# Patient Record
Sex: Female | Born: 1965 | Race: White | Hispanic: No | Marital: Single | Smoking: Never smoker
Health system: Southern US, Community
[De-identification: ages and names within clinical notes are randomized; demographics above are authoritative.]

## PROBLEM LIST (undated history)

## (undated) DIAGNOSIS — R112 Nausea with vomiting, unspecified: Secondary | ICD-10-CM

## (undated) DIAGNOSIS — M792 Neuralgia and neuritis, unspecified: Secondary | ICD-10-CM

## (undated) DIAGNOSIS — M7742 Metatarsalgia, left foot: Secondary | ICD-10-CM

## (undated) DIAGNOSIS — R87612 Low grade squamous intraepithelial lesion on cytologic smear of cervix (LGSIL): Secondary | ICD-10-CM

## (undated) DIAGNOSIS — K589 Irritable bowel syndrome without diarrhea: Secondary | ICD-10-CM

## (undated) DIAGNOSIS — R87619 Unspecified abnormal cytological findings in specimens from cervix uteri: Secondary | ICD-10-CM

## (undated) DIAGNOSIS — Z8719 Personal history of other diseases of the digestive system: Secondary | ICD-10-CM

## (undated) DIAGNOSIS — N939 Abnormal uterine and vaginal bleeding, unspecified: Secondary | ICD-10-CM

## (undated) DIAGNOSIS — E063 Autoimmune thyroiditis: Secondary | ICD-10-CM

## (undated) DIAGNOSIS — E038 Other specified hypothyroidism: Secondary | ICD-10-CM

## (undated) DIAGNOSIS — E785 Hyperlipidemia, unspecified: Secondary | ICD-10-CM

## (undated) DIAGNOSIS — Z9889 Other specified postprocedural states: Secondary | ICD-10-CM

## (undated) DIAGNOSIS — IMO0002 Reserved for concepts with insufficient information to code with codable children: Secondary | ICD-10-CM

## (undated) HISTORY — DX: Autoimmune thyroiditis: E06.3

## (undated) HISTORY — DX: Metatarsalgia, left foot: M77.42

## (undated) HISTORY — DX: Neuralgia and neuritis, unspecified: M79.2

## (undated) HISTORY — DX: Low grade squamous intraepithelial lesion on cytologic smear of cervix (LGSIL): R87.612

## (undated) HISTORY — PX: PLACEMENT OF BREAST IMPLANTS: SHX6334

## (undated) HISTORY — DX: Other specified hypothyroidism: E03.8

## (undated) HISTORY — PX: WISDOM TOOTH EXTRACTION: SHX21

## (undated) HISTORY — DX: Hyperlipidemia, unspecified: E78.5

## (undated) HISTORY — DX: Irritable bowel syndrome, unspecified: K58.9

## (undated) HISTORY — DX: Reserved for concepts with insufficient information to code with codable children: IMO0002

## (undated) HISTORY — DX: Abnormal uterine and vaginal bleeding, unspecified: N93.9

## (undated) HISTORY — PX: ENDOMETRIAL BIOPSY: SHX622

## (undated) HISTORY — PX: TONSILLECTOMY: SUR1361

## (undated) HISTORY — DX: Unspecified abnormal cytological findings in specimens from cervix uteri: R87.619

## (undated) HISTORY — PX: AUGMENTATION MAMMAPLASTY: SUR837

---

## 2005-11-05 HISTORY — PX: OTHER SURGICAL HISTORY: SHX169

## 2006-11-05 HISTORY — PX: UPPER GI ENDOSCOPY: SHX6162

## 2010-11-05 DIAGNOSIS — R87612 Low grade squamous intraepithelial lesion on cytologic smear of cervix (LGSIL): Secondary | ICD-10-CM

## 2010-11-05 HISTORY — DX: Low grade squamous intraepithelial lesion on cytologic smear of cervix (LGSIL): R87.612

## 2011-03-16 ENCOUNTER — Encounter: Payer: Self-pay | Admitting: Family Medicine

## 2011-03-16 ENCOUNTER — Ambulatory Visit (INDEPENDENT_AMBULATORY_CARE_PROVIDER_SITE_OTHER): Payer: BC Managed Care – PPO | Admitting: Family Medicine

## 2011-03-16 DIAGNOSIS — Z13220 Encounter for screening for lipoid disorders: Secondary | ICD-10-CM

## 2011-03-16 DIAGNOSIS — R635 Abnormal weight gain: Secondary | ICD-10-CM

## 2011-03-16 DIAGNOSIS — R109 Unspecified abdominal pain: Secondary | ICD-10-CM

## 2011-03-16 DIAGNOSIS — R1084 Generalized abdominal pain: Secondary | ICD-10-CM | POA: Insufficient documentation

## 2011-03-16 LAB — POCT URINALYSIS DIPSTICK
Bilirubin, UA: NEGATIVE
Glucose, UA: NEGATIVE
Leukocytes, UA: NEGATIVE
Nitrite, UA: NEGATIVE
Protein, UA: NEGATIVE
Spec Grav, UA: 1.015
Urobilinogen, UA: 0.2
pH, UA: 6

## 2011-03-16 NOTE — Assessment & Plan Note (Signed)
Vague abd pain/ bloating with similar episode 7 yrs ago, diagnosed with hiatal hernia and IBS at the time.  Has a stressful job and is more sensitive to carbs now.  UA is normal.  Will get labs today but given her exam findings and history, I suggested she try a gluten free diet x 2 wks to see if her symptoms improved along with lots of fruits, veggies, water and increased exercise.  Had a normal EGD/ colonoscopy 7 yrs ago.

## 2011-03-16 NOTE — Patient Instructions (Signed)
Update fasting labs today. Will call you w/ results Monday.  Trial of Gluten Free Diet x 2 wks may help bloating and cramping. Try Vear Clock Colon health + a fiber supplement daily.  Use OTC Zantac as needed for heartburn.

## 2011-03-16 NOTE — Progress Notes (Signed)
  Subjective:    Patient ID: [name], female    DOB: [birth year], 45 y.o.   MRN: [id number]  HPI 45 yo WF presents for lower abdominal pain x 2 wk with cramps, burning and bloating.  Bowels are normal. Has some nausea and lack of appetite.  No vomitting, diarrhea, constipation or blood in the stool.  She has a lot of stress at work.  She had some increased urinary frequency but no dysuria.  She had a colonoscopy 7 yrs ago.  She was diagnosed with a HH in the past.  Has had some heartburn.  No hx of ulcers.   She seems more sensitive to certain foods like pasta.  She has gained about 10 lbs in the past 6 mos.    BP 117/72  Pulse 74  Temp(Src) 98.4 F (36.9 C) (Oral)  Ht 5\' 5"  (1.651 m)  Wt 128 lb (58.06 kg)  BMI 21.30 kg/m2  SpO2 99%  LMP 02/22/2011  Past Medical History  Diagnosis Date  . Hyperlipidemia     History reviewed. No pertinent past surgical history.  Family History  Problem Relation Age of Onset  . Cancer Mother   . Heart disease Father     History   Social History  . Marital Status: Single    Spouse Name: Aneta Mins    Number of Children: 1  . Years of Education: 14   Occupational History  . [employer] support     [employer]   Social History Main Topics  . Smoking status: Never Smoker   . Smokeless tobacco: Not on file  . Alcohol Use: No  . Drug Use: No  . Sexually Active: Yes    Birth Control/ Protection: Condom   Other Topics Concern  . Not on file   Social History Narrative  . No narrative on file    Allergies  Allergen Reactions  . Codeine   . Erythromycin     No current outpatient prescriptions on file.    Review of Systems  Constitutional: Positive for appetite change, fatigue and unexpected weight change. Negative for fever and chills.  Respiratory: Negative for shortness of breath.   Cardiovascular: Negative for chest pain, palpitations and leg swelling.  Gastrointestinal: Positive for abdominal pain and abdominal distention. Negative  for nausea, vomiting, diarrhea, constipation and blood in stool.  Genitourinary: Positive for frequency. Negative for urgency, hematuria and difficulty urinating.  Skin: Negative for rash.  Neurological: Negative for headaches.  Psychiatric/Behavioral: Negative for sleep disturbance and dysphoric mood. The patient is not nervous/anxious.        Objective:   Physical Exam  Constitutional: She appears well-developed and well-nourished.  HENT:  Head: Normocephalic and atraumatic.  Eyes: Conjunctivae are normal. No scleral icterus.  Neck: Neck supple. No thyromegaly present.  Cardiovascular: Normal rate, regular rhythm and normal heart sounds.   Pulmonary/Chest: Effort normal and breath sounds normal.  Abdominal: Soft. Bowel sounds are normal. She exhibits no distension and no mass. There is no tenderness. There is no guarding.  Musculoskeletal: She exhibits no edema.  Lymphadenopathy:    She has no cervical adenopathy.  Skin: Skin is warm and dry. No rash noted. No pallor.  Psychiatric: She has a normal mood and affect.          Assessment & Plan:

## 2011-03-17 ENCOUNTER — Telehealth: Payer: Self-pay | Admitting: Family Medicine

## 2011-03-17 DIAGNOSIS — R140 Abdominal distension (gaseous): Secondary | ICD-10-CM

## 2011-03-17 LAB — CBC WITH DIFFERENTIAL/PLATELET
Basophils Absolute: 0 10*3/uL (ref 0.0–0.1)
Basophils Relative: 0 % (ref 0–1)
Eosinophils Absolute: 0.1 10*3/uL (ref 0.0–0.7)
Eosinophils Relative: 1 % (ref 0–5)
HCT: 41 % (ref 36.0–46.0)
Hemoglobin: 14.1 g/dL (ref 12.0–15.0)
Lymphocytes Relative: 24 % (ref 12–46)
Lymphs Abs: 2 10*3/uL (ref 0.7–4.0)
MCH: 31.3 pg (ref 26.0–34.0)
MCHC: 34.4 g/dL (ref 30.0–36.0)
MCV: 90.9 fL (ref 78.0–100.0)
Monocytes Absolute: 0.8 10*3/uL (ref 0.1–1.0)
Monocytes Relative: 9 % (ref 3–12)
Neutro Abs: 5.5 10*3/uL (ref 1.7–7.7)
Neutrophils Relative %: 66 % (ref 43–77)
Platelets: 200 10*3/uL (ref 150–400)
RBC: 4.51 MIL/uL (ref 3.87–5.11)
RDW: 12.8 % (ref 11.5–15.5)
WBC: 8.3 10*3/uL (ref 4.0–10.5)

## 2011-03-17 LAB — COMPLETE METABOLIC PANEL WITH GFR
ALT: 10 U/L (ref 0–35)
AST: 18 U/L (ref 0–37)
Albumin: 5 g/dL (ref 3.5–5.2)
Alkaline Phosphatase: 40 U/L (ref 39–117)
BUN: 12 mg/dL (ref 6–23)
CO2: 22 mEq/L (ref 19–32)
Calcium: 9.5 mg/dL (ref 8.4–10.5)
Chloride: 101 mEq/L (ref 96–112)
Creat: 0.79 mg/dL (ref 0.40–1.20)
GFR, Est African American: >60 mL/min (ref >60)
GFR, Est Non African American: >60 mL/min (ref >60)
Glucose, Bld: 74 mg/dL (ref 70–99)
Potassium: 3.6 mEq/L (ref 3.5–5.3)
Sodium: 137 mEq/L (ref 135–145)
Total Bilirubin: 1.1 mg/dL (ref 0.3–1.2)
Total Protein: 7.4 g/dL (ref 6.0–8.3)

## 2011-03-17 LAB — TSH: TSH: 3.673 u[IU]/mL (ref 0.350–4.500)

## 2011-03-17 LAB — LIPID PANEL
Cholesterol: 173 mg/dL (ref 0–200)
HDL: 64 mg/dL (ref >39)
LDL Cholesterol: 100 mg/dL — ABNORMAL HIGH (ref 0–99)
Total CHOL/HDL Ratio: 2.7 Ratio
Triglycerides: 44 mg/dL (ref <150)
VLDL: 9 mg/dL (ref 0–40)

## 2011-03-17 LAB — LIPASE: Lipase: 29 U/L (ref 0–75)

## 2011-03-17 LAB — AMYLASE: Amylase: 37 U/L (ref 0–105)

## 2011-03-17 NOTE — Telephone Encounter (Signed)
Pls let pt know that her blood counts came back normal, fasting sugar, liver and kidney function are normal.  No sign of pancreatitis.  Thyroid looks perfect.  Cholesterol is at goal.  Repeat in 1-2 yrs.

## 2011-03-19 NOTE — Telephone Encounter (Signed)
Pt aware of the above. Pt would like to know if she was tested for ciliac disease and/or gluten intolerance bc this was the reason for her visit. Please advise.

## 2011-03-19 NOTE — Telephone Encounter (Signed)
Pt aware of the above and will return to lab

## 2011-03-19 NOTE — Telephone Encounter (Signed)
Let's see if the lab can add this on.  Order printed.  Many times the lab comes back normal but patients clinically respond to a gluten free diet, so that's why I had not checked her without a trial of gluten free diet first.

## 2011-03-21 LAB — GLIADIN ANTIBODIES, SERUM
Gliadin IgA: 11 U/mL (ref <20)
Gliadin IgG: 13.6 U/mL (ref <20)

## 2011-03-21 LAB — TISSUE TRANSGLUTAMINASE, IGA: Tissue Transglutaminase Ab, IgA: 3.7 U/mL (ref <20)

## 2011-03-21 LAB — RETICULIN ANTIBODIES, IGA W TITER: Reticulin Ab, IgA: NEGATIVE

## 2011-03-22 ENCOUNTER — Telehealth: Payer: Self-pay | Admitting: Family Medicine

## 2011-03-27 ENCOUNTER — Telehealth: Payer: Self-pay | Admitting: Family Medicine

## 2011-03-27 NOTE — Telephone Encounter (Signed)
Pls let pt know that only one of her lab tests for celiac is back so far and it is negative.  I just didn't want to keep her waiting.  The other 2 should be back this wk.

## 2011-03-27 NOTE — Telephone Encounter (Signed)
Pt notified of the current celiac negative test result.  Told other two will come back this week and she'll be notified then of those results. Jarvis Newcomer, LPN [facility]

## 2011-04-04 ENCOUNTER — Encounter (INDEPENDENT_AMBULATORY_CARE_PROVIDER_SITE_OTHER): Payer: BC Managed Care – PPO | Admitting: Obstetrics & Gynecology

## 2011-04-04 ENCOUNTER — Telehealth: Payer: Self-pay | Admitting: Family Medicine

## 2011-04-04 ENCOUNTER — Other Ambulatory Visit: Payer: Self-pay | Admitting: Obstetrics & Gynecology

## 2011-04-04 DIAGNOSIS — Z01419 Encounter for gynecological examination (general) (routine) without abnormal findings: Secondary | ICD-10-CM

## 2011-04-04 DIAGNOSIS — Z1272 Encounter for screening for malignant neoplasm of vagina: Secondary | ICD-10-CM

## 2011-04-04 DIAGNOSIS — Z113 Encounter for screening for infections with a predominantly sexual mode of transmission: Secondary | ICD-10-CM

## 2011-04-04 DIAGNOSIS — Z1239 Encounter for other screening for malignant neoplasm of breast: Secondary | ICD-10-CM

## 2011-04-05 NOTE — Assessment & Plan Note (Signed)
[name], [name]                ACCOUNT NO.:  [id number]  MEDICAL RECORD NO.:  [id number]           PATIENT TYPE:  LOCATION:  [facility] at [place]           FACILITY:  PHYSICIAN:  Elsie Lincoln, MD           DATE OF BIRTH:  DATE OF SERVICE:  04/04/2011                                 CLINIC NOTE  The patient is a 45 year old G0, P0 female who presents for annual exam. The patient has a longstanding history of abdominal pain and digestive complaints.  She has had a thorough workup including CAT scan, colonoscopy and other tests.  They never really determine anything other than irritable bowel syndrome.  She would like to try the gluten testing.  However, they have not done this yet.  I suggest that she just go gluten-free for several weeks to see she feels better in the past when she tried this, but also gave up all preservative and sugar.  She felt better within the week.  I suggest she will eliminate one item at a time from her diet.  She can better recognize what is making her feel that.  She does not have any gynecological issues.  She has regular periods, medium flow with mild pain, become regular once a month.  She is using condoms for birth control in a monogamous relationship.  OBSTETRICAL HISTORY:  She is nulliparous.  GYNECOLOGIC HISTORY:  She has never had an abnormal Pap smear.  She does not know she has ever been tested for HPV.  Her last Pap smear was over a year ago.  I do have a copy of this.  We are going to do one today. She has never been told she has had endometriosis, fibroid tumors of the uterus, ovarian cyst or sexually transmitted diseases.  MEDICAL HISTORY:  Positive for digestive issues but as described above.  SURGICAL HISTORY.: 1. Back surgery secondary to lumbar disk problems. 2. Breast implants approximately 7 years ago.  SOCIAL HISTORY:  She is employed in the area and she is very stressed at work because they are down an employee and she is  working overtime.  She does not smoke.  She drinks approximately 3 drinks of alcohol a week. She does not use drugs.  She has never been sexually or physically abused.  FAMILY HISTORY:  Positive for diabetes.  There is no history of breast, colon, endometrial or ovarian cancer.  REVIEW OF SYSTEMS:  Systemic review is positive for abdominal pain as outlined above.  There are no other problems.  MEDICATIONS:  Multivitamin daily.  ALLERGIES:  CODEINE, ERYTHROMYCIN.  PHYSICAL EXAMINATION:  VITAL SIGNS:  Pulse 64, blood pressure 103/64, weight 132, height 65 inches. GENERAL:  Well nourished, well developed, no apparent distress. HEENT:  Normocephalic, atraumatic.  Good dentition. NECK:  No masses, no thyromegaly. LUNGS:  Clear to auscultation bilaterally. HEART:  Regular rate and rhythm. BREASTS:  No masses, nontender, presence of implants.  No lymphadenopathy.  No nipple discharge. ABDOMEN:  Soft, nontender.  No organomegaly.  No hernia. PELVIC:  Genitalia, Tanner V.  No lesions on the vulva.  Vagina pink, normal rugae.  Cervix closed, nontender.  Uterus anteverted, nontender. Adnexa; no  masses, nontender, rectovaginal nodularity.  No cystocele. No rectocele.  No hemorrhoids.  EXTREMITIES:  No edema.  ASSESSMENT/PLAN:  A 45 year old female for well-woman exam. 1. Pap smear. 2. Mammogram. 3. Return to the office in a year or sooner as needed.          ______________________________ Elsie Lincoln, MD    KL/MEDQ  D:  04/04/2011  T:  04/04/2011  Job:  [id number]

## 2011-04-09 ENCOUNTER — Telehealth: Payer: Self-pay | Admitting: Family Medicine

## 2011-04-09 NOTE — Telephone Encounter (Signed)
Sherry from lab will look into.

## 2011-04-09 NOTE — Telephone Encounter (Signed)
Pls let pt know that we tracked down the rest of her labs and they were all negative for celiac dz.  Is she set up to see GI yet?

## 2011-04-09 NOTE — Telephone Encounter (Signed)
[name], pls call the lab to see what the hold up is on her remaining labs.  Thanks.

## 2011-04-09 NOTE — Telephone Encounter (Signed)
Pt aware of the above and does not wish to see GI right now.

## 2011-04-20 ENCOUNTER — Encounter: Payer: Self-pay | Admitting: Family Medicine

## 2011-04-25 NOTE — Telephone Encounter (Signed)
none

## 2011-04-30 ENCOUNTER — Telehealth: Payer: Self-pay | Admitting: Family Medicine

## 2011-04-30 NOTE — Telephone Encounter (Signed)
Pls let pt know that her blood testing for Celiac Disease came back NEGATIVE.  If she feels better on a gluten free diet, I suggest that she stay on it.  She may still have gluten sensitivity which is not picked up on testing.

## 2011-05-01 NOTE — Telephone Encounter (Signed)
Pt aware of the above  

## 2011-05-02 ENCOUNTER — Encounter (INDEPENDENT_AMBULATORY_CARE_PROVIDER_SITE_OTHER): Payer: BC Managed Care – PPO | Admitting: Obstetrics & Gynecology

## 2011-05-02 ENCOUNTER — Other Ambulatory Visit: Payer: Self-pay | Admitting: Obstetrics & Gynecology

## 2011-05-02 DIAGNOSIS — R87610 Atypical squamous cells of undetermined significance on cytologic smear of cervix (ASC-US): Secondary | ICD-10-CM

## 2011-05-15 ENCOUNTER — Ambulatory Visit: Payer: BC Managed Care – PPO

## 2011-05-17 ENCOUNTER — Encounter: Payer: Self-pay | Admitting: Family Medicine

## 2011-06-05 NOTE — Telephone Encounter (Signed)
Error

## 2011-08-03 ENCOUNTER — Ambulatory Visit (INDEPENDENT_AMBULATORY_CARE_PROVIDER_SITE_OTHER): Payer: BC Managed Care – PPO | Admitting: Family Medicine

## 2011-08-03 ENCOUNTER — Encounter: Payer: Self-pay | Admitting: Family Medicine

## 2011-08-03 VITALS — BP 108/74 | HR 74 | Wt 131.0 lb

## 2011-08-03 DIAGNOSIS — R197 Diarrhea, unspecified: Secondary | ICD-10-CM

## 2011-08-03 LAB — CBC WITH DIFFERENTIAL/PLATELET
Basophils Absolute: 0 10*3/uL (ref 0.0–0.1)
Basophils Relative: 0 % (ref 0–1)
Eosinophils Absolute: 0.1 10*3/uL (ref 0.0–0.7)
Eosinophils Relative: 2 % (ref 0–5)
HCT: 42.1 % (ref 36.0–46.0)
Hemoglobin: 14.2 g/dL (ref 12.0–15.0)
Lymphocytes Relative: 18 % (ref 12–46)
Lymphs Abs: 1.4 10*3/uL (ref 0.7–4.0)
MCH: 31.1 pg (ref 26.0–34.0)
MCHC: 33.7 g/dL (ref 30.0–36.0)
MCV: 92.3 fL (ref 78.0–100.0)
Monocytes Absolute: 0.9 10*3/uL (ref 0.1–1.0)
Monocytes Relative: 12 % (ref 3–12)
Neutro Abs: 5 10*3/uL (ref 1.7–7.7)
Neutrophils Relative %: 68 % (ref 43–77)
Platelets: 206 10*3/uL (ref 150–400)
RBC: 4.56 MIL/uL (ref 3.87–5.11)
RDW: 12.8 % (ref 11.5–15.5)
WBC: 7.3 10*3/uL (ref 4.0–10.5)

## 2011-08-03 NOTE — Progress Notes (Signed)
  Subjective:    Patient ID: [name], female    DOB: May 01, 1966, 45 y.o.   MRN: [id number]  HPI Diarrhea for about 1.5 weeks. Living off immodium right now. Stools are watery. Maybe low grade. No blood in the stool. She is getting painand cramping. Eating triggers the diarrhea. No prior hx of crohns, etc. On family hx of Gi d/o.  + nausea. No vomiting.   Stomach is constantly rumbling. Cat recently had giardia.   Review of Systems     Objective:   Physical Exam  Constitutional: She is oriented to person, place, and time. She appears well-developed and well-nourished.  HENT:  Head: Normocephalic and atraumatic.  Cardiovascular: Normal rate, regular rhythm and normal heart sounds.   Pulmonary/Chest: Effort normal and breath sounds normal.  Abdominal: Soft. Bowel sounds are normal. She exhibits no distension and no mass. There is no tenderness. There is no rebound and no guarding.  Neurological: She is alert and oriented to person, place, and time.  Skin: Skin is warm and dry.  Psychiatric: She has a normal mood and affect. Her behavior is normal.          Assessment & Plan:  Diarrhea x 2 weeks.  - will check for stool culture and for giardia. Rec stop the immodium unless absolutely has to. Stay hydrated. Will check CBC as well. I think diverticulitis is unlikely.

## 2011-08-03 NOTE — Patient Instructions (Signed)
Stay really well hydrated We will call you with the labs results.

## 2011-08-06 ENCOUNTER — Telehealth: Payer: Self-pay | Admitting: *Deleted

## 2011-08-06 NOTE — Telephone Encounter (Signed)
Message copied by Wyline Beady on Mon Aug 06, 2011  9:12 AM ------      Message from: Nani Gasser D      Created: Fri Aug 03, 2011  5:24 PM       white count is normal. No sign of systemic infection

## 2011-08-06 NOTE — Telephone Encounter (Signed)
Pt.notified

## 2011-08-07 ENCOUNTER — Telehealth: Payer: Self-pay | Admitting: *Deleted

## 2011-08-07 LAB — GIARDIA ANTIGEN: Giardia Screen (EIA): NEGATIVE

## 2011-08-07 MED ORDER — CIPROFLOXACIN HCL 500 MG PO TABS: 500.0000 mg | ORAL_TABLET | Freq: Two times a day (BID) | ORAL | Status: AC

## 2011-08-07 NOTE — Telephone Encounter (Signed)
Pt notified of results.Pt states is still having the diarrhea along with occasional flu like symptoms- joint aches, nausea but no vomiting, chills, sweats

## 2011-08-07 NOTE — Telephone Encounter (Signed)
I will send over ABX. Let me know if not better by the end of the ABX. If not better at that point will send ot GI for further eval.

## 2011-08-07 NOTE — Telephone Encounter (Signed)
Message copied by Lanae Crumbly on Tue Aug 07, 2011  3:53 PM ------      Message from: Nani Gasser D      Created: Tue Aug 07, 2011  1:00 PM       Neg for giardia. Is she still having a lot of diarrhea?

## 2011-08-08 ENCOUNTER — Telehealth: Payer: Self-pay | Admitting: *Deleted

## 2011-08-08 NOTE — Telephone Encounter (Signed)
Pt notified. KJ LPN 

## 2011-08-08 NOTE — Telephone Encounter (Signed)
Pt notified med sent and MD instructions. KJ LPN

## 2011-08-08 NOTE — Telephone Encounter (Signed)
Message copied by Lanae Crumbly on Wed Aug 08, 2011 10:14 AM ------      Message from: Nani Gasser D      Created: Tue Aug 07, 2011  5:33 PM       stool cultures were reincubated for better growth

## 2011-08-10 LAB — STOOL CULTURE

## 2011-08-28 ENCOUNTER — Ambulatory Visit: Payer: BC Managed Care – PPO

## 2012-04-08 ENCOUNTER — Encounter: Payer: Self-pay | Admitting: Obstetrics & Gynecology

## 2012-04-08 ENCOUNTER — Ambulatory Visit (INDEPENDENT_AMBULATORY_CARE_PROVIDER_SITE_OTHER): Payer: BC Managed Care – PPO | Admitting: Obstetrics & Gynecology

## 2012-04-08 VITALS — BP 103/66 | HR 64 | Temp 98.3°F | Resp 16 | Ht 65.0 in | Wt 129.0 lb

## 2012-04-08 DIAGNOSIS — Z0000 Encounter for general adult medical examination without abnormal findings: Secondary | ICD-10-CM

## 2012-04-08 DIAGNOSIS — Z1151 Encounter for screening for human papillomavirus (HPV): Secondary | ICD-10-CM

## 2012-04-08 DIAGNOSIS — Z113 Encounter for screening for infections with a predominantly sexual mode of transmission: Secondary | ICD-10-CM

## 2012-04-08 DIAGNOSIS — Z124 Encounter for screening for malignant neoplasm of cervix: Secondary | ICD-10-CM

## 2012-04-08 DIAGNOSIS — Z1231 Encounter for screening mammogram for malignant neoplasm of breast: Secondary | ICD-10-CM

## 2012-04-08 NOTE — Progress Notes (Signed)
Patient ID: [name], female   DOB: [birth year], 46 y.o.   MRN: [id number] Subjective:    [name] is a 46 y.o. female who presents for an annual exam. The patient has no complaints today. The patient is sexually active. GYN screening history: last pap: was normal. The patient wears seatbelts: yes. The patient participates in regular exercise: no. Has the patient ever been transfused or tattooed?: no. The patient reports that there is not domestic violence in her life.   Menstrual History: OB History    Grav Para Term Preterm Abortions TAB SAB Ect Mult Living                  Menarche age: 46 Patient's last menstrual period was 03/12/2012.    The following portions of the patient's history were reviewed and updated as appropriate: allergies, current medications, past family history, past medical history, past social history, past surgical history and problem list.  Review of Systems A comprehensive review of systems was negative. She is in a monogamous relationship, divorced. She moved here in 2011 from [place]. She works in Therapist, music for [employer].   Objective:    BP 103/66  Pulse 64  Temp(Src) 98.3 F (36.8 C) (Oral)  Resp 16  Ht 5\' 5"  (1.651 m)  Wt 58.514 kg (129 lb)  BMI 21.47 kg/m2  LMP 03/12/2012  General Appearance:    Alert, cooperative, no distress, appears stated age  Head:    Normocephalic, without obvious abnormality, atraumatic  Eyes:    PERRL, conjunctiva/corneas clear, EOM's intact, fundi    benign, both eyes  Ears:    Normal TM's and external ear canals, both ears  Nose:   Nares normal, septum midline, mucosa normal, no drainage    or sinus tenderness  Throat:   Lips, mucosa, and tongue normal; teeth and gums normal  Neck:   Supple, symmetrical, trachea midline, no adenopathy;    thyroid:  no enlargement/tenderness/nodules; no carotid   bruit or JVD  Back:     Symmetric, no curvature, ROM normal, no CVA tenderness  Lungs:     Clear to auscultation  bilaterally, respirations unlabored  Chest Wall:    No tenderness or deformity   Heart:    Regular rate and rhythm, S1 and S2 normal, no murmur, rub   or gallop  Breast Exam:    No tenderness, masses, or nipple abnormality  Abdomen:     Soft, non-tender, bowel sounds active all four quadrants,    no masses, no organomegaly  Genitalia:    Normal female without lesion, discharge or tenderness, NSSA, NT, no adnexal masses     Extremities:   Extremities normal, atraumatic, no cyanosis or edema  Pulses:   2+ and symmetric all extremities  Skin:   Skin color, texture, turgor normal, no rashes or lesions  Lymph nodes:   Cervical, supraclavicular, and axillary nodes normal  Neurologic:   CNII-XII intact, normal strength, sensation and reflexes    throughout  .    Assessment:    Healthy female exam.    Plan:     Mammogram. Pap smear.

## 2012-04-08 NOTE — Progress Notes (Signed)
Addended by: Granville Lewis on: 04/08/2012 04:20 PM   Modules accepted: Orders

## 2012-04-08 NOTE — Progress Notes (Signed)
Addended by: Granville Lewis on: 04/08/2012 04:26 PM   Modules accepted: Orders

## 2012-04-08 NOTE — Progress Notes (Signed)
Addended by: Allie Bossier on: 04/08/2012 04:32 PM   Modules accepted: Orders

## 2012-05-05 ENCOUNTER — Other Ambulatory Visit: Payer: Self-pay | Admitting: Obstetrics & Gynecology

## 2012-05-05 DIAGNOSIS — Z1231 Encounter for screening mammogram for malignant neoplasm of breast: Secondary | ICD-10-CM

## 2012-05-20 ENCOUNTER — Ambulatory Visit: Payer: BC Managed Care – PPO

## 2012-05-21 ENCOUNTER — Ambulatory Visit (HOSPITAL_BASED_OUTPATIENT_CLINIC_OR_DEPARTMENT_OTHER)
Admission: RE | Admit: 2012-05-21 | Discharge: 2012-05-21 | Disposition: A | Discharge status: Home / Self Care | Payer: BC Managed Care – PPO | Source: Ambulatory Visit | Attending: Obstetrics & Gynecology | Admitting: Obstetrics & Gynecology

## 2012-05-21 DIAGNOSIS — Z978 Presence of other specified devices: Secondary | ICD-10-CM | POA: Insufficient documentation

## 2012-05-21 DIAGNOSIS — Z1231 Encounter for screening mammogram for malignant neoplasm of breast: Secondary | ICD-10-CM | POA: Insufficient documentation

## 2017-05-01 ENCOUNTER — Encounter: Payer: Self-pay | Admitting: Obstetrics & Gynecology

## 2017-05-01 ENCOUNTER — Ambulatory Visit (INDEPENDENT_AMBULATORY_CARE_PROVIDER_SITE_OTHER): Payer: BLUE CROSS/BLUE SHIELD | Admitting: Obstetrics & Gynecology

## 2017-05-01 VITALS — BP 95/68 | HR 71 | Ht 65.0 in | Wt 140.0 lb

## 2017-05-01 DIAGNOSIS — Z01419 Encounter for gynecological examination (general) (routine) without abnormal findings: Secondary | ICD-10-CM

## 2017-05-01 DIAGNOSIS — Z0000 Encounter for general adult medical examination without abnormal findings: Secondary | ICD-10-CM

## 2017-05-01 NOTE — Progress Notes (Signed)
Subjective:     [name] is a 51 y.o. female here for a routine exam.  Current complaints: Last menses lasted 4 weeks.  Before this pt has monthly cycles with menses lasting 5 days.    Gynecologic History Patient's last menstrual period was 03/17/2017 (approximate). Contraception: none Last Pap: 2013. Results were: normal Last mammogram: 2013. Results were: normal  Obstetric History OB History  Gravida Para Term Preterm AB Living  0 0 0 0 0 0  SAB TAB Ectopic Multiple Live Births  0 0 0 0 0         The following portions of the patient's history were reviewed and updated as appropriate: allergies, current medications, past family history, past medical history, past social history, past surgical history and problem list.  Review of Systems Pertinent items noted in HPI and remainder of comprehensive ROS otherwise negative.    Objective:      Vitals:   05/01/17 1017  BP: 95/68  Pulse: 71  Weight: 140 lb (63.5 kg)  Height: 5\' 5"  (1.651 m)   Vitals:  WNL General appearance: alert, cooperative and no distress  HEENT: Normocephalic, without obvious abnormality, atraumatic Eyes: negative Throat: lips, mucosa, and tongue normal; teeth and gums normal  Respiratory: Clear to auscultation bilaterally  CV: Regular rate and rhythm  Breasts:  Normal appearance, no masses or tenderness, no nipple retraction or dimpling  GI: Soft, non-tender; bowel sounds normal; no masses,  no organomegaly  GU: External Genitalia:  Tanner V, no lesion Urethra:  No prolapse   Vagina: Pink, normal rugae, no blood or discharge  Cervix: No CMT, no lesion  Uterus:  Normal size and contour, non tender  Adnexa: Normal, no masses, non tender  Musculoskeletal: No edema, redness or tenderness in the calves or thighs  Skin: No lesions or rash  Lymphatic: Axillary adenopathy: none     Psychiatric: Normal mood and behavior     Assessment:    Healthy female exam.    Plan:   Establish primary  care Pap smear with code testing today Mammogram with implants today ordered CBC CMP TSH ordered Patient offered endometrial biopsy and transvaginal ultrasound for month long menstrual cycle last month. Patient also was given the option to see how next menstrual cycle was. Patient opts to wait. Patient understands if the next menstrual cycle is long or she has intermenstrual spotting, she is to come to the office in get a transvaginal ultrasound and endometrial biopsy. She understands that irregular bleeding can be a sign of endometrial cancer. Colonoscopy recommended.

## 2017-05-03 LAB — CYTOLOGY - PAP
Diagnosis: NEGATIVE
HPV: NOT DETECTED

## 2017-05-09 ENCOUNTER — Telehealth: Payer: Self-pay | Admitting: *Deleted

## 2017-05-09 NOTE — Telephone Encounter (Signed)
-----   Message from Guss Bunde, MD sent at 05/07/2017  9:03 PM EDT ----- Call patient and remind to come in for labs (fasting).

## 2017-05-09 NOTE — Telephone Encounter (Signed)
LM on voicemail reminding pt that she has not come in for her labs yet and encouraged to call for appt.

## 2017-05-10 ENCOUNTER — Telehealth: Payer: Self-pay | Admitting: General Practice

## 2017-05-10 NOTE — Telephone Encounter (Signed)
Left a detailed message on patient vm with advise as noted below. Deeya Richeson,CMA

## 2017-05-10 NOTE — Telephone Encounter (Signed)
It looks like Dr. Gala Romney already ordered her blood work so she should have her lab order from them or she can stop by their office in our [place] and pick up a copy.

## 2017-05-10 NOTE — Telephone Encounter (Signed)
[name], This patient is new and is requesting a physical. She wants to get her bloodwork done before her appt and wants to pick up lab order on Tuesday, July 10th, her appt is on July 18th.  Thank you.

## 2017-05-10 NOTE — Telephone Encounter (Signed)
Pt called.  Pt has not been seen since 2012 and she's  requesting a Physical  and wants to pick up Lab Order  On Tuesday. Her appt is on Thursday.  She's a former [facility] pt but she has seen Dr Madilyn Fireman. Thank you

## 2017-05-10 NOTE — Telephone Encounter (Signed)
Dr. Madilyn Fireman denied this request.  Pt would have to establish with a different provider that is accepting new patient. -EH/RMA

## 2017-05-14 ENCOUNTER — Other Ambulatory Visit: Payer: Self-pay | Admitting: Obstetrics & Gynecology

## 2017-05-14 LAB — CBC
HCT: 44.6 % (ref 35.0–45.0)
Hemoglobin: 14.3 g/dL (ref 11.7–15.5)
MCH: 31.3 pg (ref 27.0–33.0)
MCHC: 32.1 g/dL (ref 32.0–36.0)
MCV: 97.6 fL (ref 80.0–100.0)
MPV: 10 fL (ref 7.5–12.5)
Platelets: 216 10*3/uL (ref 140–400)
RBC: 4.57 MIL/uL (ref 3.80–5.10)
RDW: 13.1 % (ref 11.0–15.0)
WBC: 6.6 10*3/uL (ref 3.8–10.8)

## 2017-05-15 LAB — COMPREHENSIVE METABOLIC PANEL
ALT: 8 U/L (ref 6–29)
AST: 14 U/L (ref 10–35)
Albumin: 4.4 g/dL (ref 3.6–5.1)
Alkaline Phosphatase: 42 U/L (ref 33–130)
BUN: 12 mg/dL (ref 7–25)
CO2: 23 mmol/L (ref 20–31)
Calcium: 9.1 mg/dL (ref 8.6–10.4)
Chloride: 105 mmol/L (ref 98–110)
Creat: 1.04 mg/dL (ref 0.50–1.05)
Glucose, Bld: 94 mg/dL (ref 65–99)
Potassium: 4.6 mmol/L (ref 3.5–5.3)
Sodium: 139 mmol/L (ref 135–146)
Total Bilirubin: 0.8 mg/dL (ref 0.2–1.2)
Total Protein: 6.8 g/dL (ref 6.1–8.1)

## 2017-05-15 LAB — LIPID PANEL
Cholesterol: 189 mg/dL (ref <200)
HDL: 76 mg/dL (ref >50)
LDL Cholesterol: 100 mg/dL — ABNORMAL HIGH (ref <100)
Total CHOL/HDL Ratio: 2.5 Ratio (ref <5.0)
Triglycerides: 67 mg/dL (ref <150)
VLDL: 13 mg/dL (ref <30)

## 2017-05-15 LAB — TSH: TSH: 5.67 mIU/L — ABNORMAL HIGH

## 2017-05-16 ENCOUNTER — Ambulatory Visit: Payer: BLUE CROSS/BLUE SHIELD | Admitting: Family Medicine

## 2017-05-16 LAB — T4, FREE: Free T4: 1.2 ng/dL (ref 0.8–1.8)

## 2017-05-20 LAB — FREE T3 BY DIALYSIS WITH T3 TOTAL
Free T3: 194 pg/dL — ABNORMAL LOW (ref 210–440)
T3, Total: 93 ng/dL (ref 76–181)

## 2017-05-22 ENCOUNTER — Other Ambulatory Visit: Payer: Self-pay | Admitting: Physician Assistant

## 2017-05-22 ENCOUNTER — Ambulatory Visit (INDEPENDENT_AMBULATORY_CARE_PROVIDER_SITE_OTHER): Payer: BLUE CROSS/BLUE SHIELD | Admitting: Physician Assistant

## 2017-05-22 ENCOUNTER — Encounter: Payer: Self-pay | Admitting: Physician Assistant

## 2017-05-22 VITALS — BP 115/68 | HR 71 | Wt 139.0 lb

## 2017-05-22 DIAGNOSIS — E063 Autoimmune thyroiditis: Secondary | ICD-10-CM | POA: Diagnosis not present

## 2017-05-22 DIAGNOSIS — Z8379 Family history of other diseases of the digestive system: Secondary | ICD-10-CM | POA: Diagnosis not present

## 2017-05-22 DIAGNOSIS — R1084 Generalized abdominal pain: Secondary | ICD-10-CM

## 2017-05-22 DIAGNOSIS — Z7689 Persons encountering health services in other specified circumstances: Secondary | ICD-10-CM

## 2017-05-22 DIAGNOSIS — E038 Other specified hypothyroidism: Secondary | ICD-10-CM

## 2017-05-22 DIAGNOSIS — E039 Hypothyroidism, unspecified: Secondary | ICD-10-CM

## 2017-05-22 DIAGNOSIS — R768 Other specified abnormal immunological findings in serum: Secondary | ICD-10-CM | POA: Diagnosis not present

## 2017-05-22 DIAGNOSIS — Z1231 Encounter for screening mammogram for malignant neoplasm of breast: Secondary | ICD-10-CM | POA: Diagnosis not present

## 2017-05-22 HISTORY — DX: Other specified hypothyroidism: E03.8

## 2017-05-22 HISTORY — DX: Autoimmune thyroiditis: E06.3

## 2017-05-22 MED ORDER — LEVOTHYROXINE SODIUM 25 MCG PO TABS
25.0000 ug | ORAL_TABLET | Freq: Every day | ORAL | 3 refills | Status: DC
Start: 2017-05-22 — End: 2017-09-03

## 2017-05-22 MED ORDER — DICYCLOMINE HCL 10 MG PO CAPS: 10.0000 mg | ORAL_CAPSULE | Freq: Three times a day (TID) | ORAL | 1 refills | Status: DC

## 2017-05-22 NOTE — Progress Notes (Signed)
HPI:                                                                [name] is a 51 y.o. female who presents to [facility] [place] today to establish care  Current Concerns include abdominal pain  Patient reports history of IBS and hiatal hernia. States she is having recurrence of generalized abdominal pain similar to symptoms she had 10 years ago when she was diagnosed with IBS. Pain has recurred over the last 2 months. Pain is burning and intermittent. Pain is brought on after meals, and she thinks gluten may be a trigger. Endorses intermittent nausea. She has tried OTC Zantac with some relief. Denies fever, chills, unintended weight loss, change in bowel patterns, hematochezia or melena. Family history is significant for celiac sprue in her brother.  Abnormal TSH: patient was recently seen by her GYN and found to have elevated TSH and low T3 on routine labs. She does endorse fatigue and dry skin. Reports her identical twin has hypothyroidism.  Health Maintenance Health Maintenance  Topic Date Due  . Hepatitis C Screening  May 05, 1966  . HIV Screening  04/02/1979  . TETANUS/TDAP  04/02/1983  . COLONOSCOPY  04/01/2014  . MAMMOGRAM  05/21/2014  . INFLUENZA VACCINE  06/05/2017  . PAP SMEAR  05/01/2020    GYN/Sexual Health  Menstrual status: having periods  LMP: 05/08/17  Menses: regular  Last pap smear: 05/01/17, NILM, HPV neg.  History of abnormal pap smears: yes, LGSIL, CIN1 2012  Sexually active: yes  Current contraception: none   Past Medical History:  Diagnosis Date  . Abnormal pap   . Hiatal hernia   . Hyperlipidemia   . IBS (irritable bowel syndrome)   . LGSIL on Pap smear of cervix 2012   Past Surgical History:  Procedure Laterality Date  . microdiskectomy  2007   L spine, done in [place]  . PLACEMENT OF BREAST IMPLANTS    . TONSILLECTOMY    . UPPER GI ENDOSCOPY  2008  . WISDOM TOOTH EXTRACTION     Social History   Substance Use Topics  . Smoking status: Never Smoker  . Smokeless tobacco: Never Used  . Alcohol use 1.8 oz/week    3 Cans of beer per week   family history includes Celiac disease in her brother; Diabetes in her mother; Thyroid disease in her sister.  ROS: Review of Systems  Constitutional: Positive for malaise/fatigue. Negative for chills, fever and weight loss.  HENT: Negative.   Eyes: Negative.   Respiratory: Negative.   Cardiovascular: Negative.   Gastrointestinal: Positive for abdominal pain and nausea. Negative for blood in stool, constipation, diarrhea, melena and vomiting.  Genitourinary: Negative.   Musculoskeletal: Positive for joint pain (left metatarsals). Negative for falls and myalgias.  Skin: Negative.   Neurological: Negative.  Negative for weakness.  Endo/Heme/Allergies: Negative.   Psychiatric/Behavioral: The patient is nervous/anxious and has insomnia.      Medications: Current Outpatient Prescriptions  Medication Sig Dispense Refill  . dicyclomine (BENTYL) 10 MG capsule Take 1 capsule (10 mg total) by mouth 4 (four) times daily -  before meals and at bedtime. 120 capsule 1  . levothyroxine (SYNTHROID) 25 MCG tablet Take 1 tablet (25 mcg total)  by mouth daily before breakfast. 30 tablet 3   No current facility-administered medications for this visit.    Allergies  Allergen Reactions  . Codeine   . Erythromycin        Objective:  BP 115/68   Pulse 71   Wt 139 lb (63 kg)   LMP 05/08/2017   BMI 23.13 kg/m  Gen: well-groomed, cooperative, not ill-appearing, no distress HEENT: normal conjunctiva, oropharynx clear, moist mucus membranes, no thyromegaly or tenderness, neck supple, trachea midline Pulm: Normal work of breathing, normal phonation, clear to auscultation bilaterally CV: Normal rate, regular rhythm, s1 and s2 distinct, no murmurs, clicks or rubs, no carotid bruit GI: abdomen soft, nondistended, nontender, no masses, negative Murphy's  sign Neuro: alert and oriented x 3, EOM's intact,, normal tone, no tremor MSK: moving all extremities, normal gait and station, no peripheral edema Skin: warm and dry, no rashes or lesions on exposed skin Psych: normal affect, euthymic mood, normal speech and thought content   No results found for this or any previous visit (from the past 72 hour(s)). No results found.  Depression screen PHQ 2/9 05/22/2017  Decreased Interest 0  Down, Depressed, Hopeless 0  PHQ - 2 Score 0   Lab Results  Component Value Date   NA 139 05/14/2017   K 4.6 05/14/2017   CL 105 05/14/2017   CO2 23 05/14/2017   Lab Results  Component Value Date   ALT 8 05/14/2017   AST 14 05/14/2017   ALKPHOS 42 05/14/2017   BILITOT 0.8 05/14/2017    Lab Results  Component Value Date   TSH 5.67 (H) 05/14/2017   Lab Results  Component Value Date   WBC 6.6 05/14/2017   HGB 14.3 05/14/2017   HCT 44.6 05/14/2017   MCV 97.6 05/14/2017   PLT 216 05/14/2017   .  Assessment and Plan: 51 y.o. female with  1. Encounter to establish care - reviewed PMH - reviewed recent labs from 05/14/17, which were unremarkable except for thyroid dysfunction - reviewed Health Maintenance - Pap UTD - Colonoscopy due - Mammogram due - negative PHQ2/depression  screen  2. Generalized abdominal pain - no constitutional symptoms, change in bowel habits or blood in stool. Suspect multifactorial due to GERD and IBS. Will treat conservatively with scheduled Zantac and Bentyl daily - patient to try elimination diet and food diary - follow-up in 4 weeks - dicyclomine (BENTYL) 10 MG capsule; Take 1 capsule (10 mg total) by mouth 4 (four) times daily -  before meals and at bedtime.  Dispense: 120 capsule; Refill: 1  3. Encounter for screening mammogram for breast cancer - [facility] Digital Screening W/ Implants; Future  4. Hypothyroidism, unspecified type Lab Results  Component Value Date   TSH 5.67 (H) 05/14/2017  - family history of  hypothyroidism in first degree relative (identical twin sister). Patient is symptomatic. Will start low-dose Synthroid and recheck TSH in 6 weeks - levothyroxine (SYNTHROID) 25 MCG tablet; Take 1 tablet (25 mcg total) by mouth daily before breakfast.  Dispense: 30 tablet; Refill: 3 - TSH; Future - Thyroid peroxidase antibody  5. Family history of celiac sprue - Tissue Transglutaminase Abs,IgG,IgA   Patient education and anticipatory guidance given Patient agrees with treatment plan Follow-up in 4 weeks or sooner as needed  Darlyne Russian PA-C

## 2017-05-22 NOTE — Patient Instructions (Signed)
-   Start Synthroid 1 tab every morning before breakfast with water - Return to the lab in 6 weeks  - Continue Zantac 150mg  daily or twice a day - Start Bentyl as needed for abdominal pain or scheduled before meals and at bedtime - Start elimination diet and food diary. For IBS, a low FOD-MAP diet is recommended - Follow-up in 4 weeks  Diet for Irritable Bowel Syndrome When you have irritable bowel syndrome (IBS), the foods you eat and your eating habits are very important. IBS may cause various symptoms, such as abdominal pain, constipation, or diarrhea. Choosing the right foods can help ease discomfort caused by these symptoms. Work with your health care provider and dietitian to find the best eating plan to help control your symptoms. What general guidelines do I need to follow?  Keep a food diary. This will help you identify foods that cause symptoms. Write down: ? What you eat and when. ? What symptoms you have. ? When symptoms occur in relation to your meals.  Avoid foods that cause symptoms. Talk with your dietitian about other ways to get the same nutrients that are in these foods.  Eat more foods that contain fiber. Take a fiber supplement if directed by your dietitian.  Eat your meals slowly, in a relaxed setting.  Aim to eat 5-6 small meals per day. Do not skip meals.  Drink enough fluids to keep your urine clear or pale yellow.  Ask your health care provider if you should take an over-the-counter probiotic during flare-ups to help restore healthy gut bacteria.  If you have cramping or diarrhea, try making your meals low in fat and high in carbohydrates. Examples of carbohydrates are pasta, rice, whole grain breads and cereals, fruits, and vegetables.  If dairy products cause your symptoms to flare up, try eating less of them. You might be able to handle yogurt better than other dairy products because it contains bacteria that help with digestion. What foods are not  recommended? The following are some foods and drinks that may worsen your symptoms:  Fatty foods, such as [place] fries.  Milk products, such as cheese or ice cream.  Chocolate.  Alcohol.  Products with caffeine, such as coffee.  Carbonated drinks, such as soda.  The items listed above may not be a complete list of foods and beverages to avoid. Contact your dietitian for more information. What foods are good sources of fiber? Your health care provider or dietitian may recommend that you eat more foods that contain fiber. Fiber can help reduce constipation and other IBS symptoms. Add foods with fiber to your diet a little at a time so that your body can get used to them. Too much fiber at once might cause gas and swelling of your abdomen. The following are some foods that are good sources of fiber:  Apples.  Peaches.  Pears.  Berries.  Figs.  Broccoli (raw).  Cabbage.  Carrots.  Raw peas.  Kidney beans.  Lima beans.  Whole grain bread.  Whole grain cereal.  Where to find more information: [employer] for Functional Gastrointestinal Disorders: www.iffgd.[employer] of Diabetes and Digestive and Kidney Diseases: NetworkAffair.co.za.aspx This information is not intended to replace advice given to you by your health care provider. Make sure you discuss any questions you have with your health care provider. Document Released: 01/12/2004 Document Revised: 03/29/2016 Document Reviewed: 01/22/2014 Elsevier Interactive Patient Education  2018 [employer].

## 2017-05-23 LAB — THYROID PEROXIDASE ANTIBODY: Thyroperoxidase Ab SerPl-aCnc: 233 IU/mL — ABNORMAL HIGH (ref <9)

## 2017-05-23 LAB — TISSUE TRANSGLUTAMINASE ABS,IGG,IGA
Tissue Transglut Ab: 4 U/mL (ref <6)
Tissue Transglutaminase Ab, IgA: 6 U/mL — ABNORMAL HIGH (ref <4)

## 2017-05-25 LAB — TSH: TSH: 4.56 mIU/L — ABNORMAL HIGH

## 2017-05-26 NOTE — Progress Notes (Signed)
Blood work confirms that elevated TSH is due to an autoimmune disorder called Hashimoto's thyroiditis. This causes the body's immune system to produce antibodies against the thyroid. It is genetic.  Recheck TSH in 6 weeks

## 2017-05-27 DIAGNOSIS — R768 Other specified abnormal immunological findings in serum: Secondary | ICD-10-CM | POA: Insufficient documentation

## 2017-05-27 NOTE — Addendum Note (Signed)
Addended by: Nelson Chimes E on: 05/27/2017 05:18 PM   Modules accepted: Orders

## 2017-05-27 NOTE — Progress Notes (Signed)
Celiac antibody was mildly positive, increasing the likelihood of celiac disease Referring to GI for further management They may perform endoscopy and biopsy to confirm the diagnosis

## 2017-06-11 ENCOUNTER — Encounter: Payer: Self-pay | Admitting: Physician Assistant

## 2017-06-27 ENCOUNTER — Ambulatory Visit (INDEPENDENT_AMBULATORY_CARE_PROVIDER_SITE_OTHER): Payer: BLUE CROSS/BLUE SHIELD | Admitting: Physician Assistant

## 2017-06-27 ENCOUNTER — Encounter: Payer: Self-pay | Admitting: Physician Assistant

## 2017-06-27 VITALS — BP 100/68 | HR 64 | Ht 65.0 in | Wt 140.0 lb

## 2017-06-27 DIAGNOSIS — R899 Unspecified abnormal finding in specimens from other organs, systems and tissues: Secondary | ICD-10-CM

## 2017-06-27 DIAGNOSIS — R1084 Generalized abdominal pain: Secondary | ICD-10-CM

## 2017-06-27 NOTE — Patient Instructions (Signed)
Please call back and ask to speak with Amy Esterwood's nurse, Beth,  about proceeding with Endoscopy and Colonoscopy.

## 2017-06-28 NOTE — Progress Notes (Addendum)
Subjective:    Patient ID: [name], female    DOB: 1966-06-13, 51 y.o.   MRN: [id number]  HPI [name] is a pleasant 51 year old white female, new to GI today referred by Rob Bunting PA-C for evaluation of elevated TTG. Patient has history of hypothyroidism and Raynauds. She reports having undergone GI evaluation in [place] around 12 years ago. She does not have those records and does not recall who she saw. She says she underwent several studies including endoscopy and colonoscopy which she was told was negative. She was given a diagnosis of IBS. She does not think that she had celiac testing done. After that she says she drastically altered her diet and when off of preservative's processed foods and gluten and had significant improvement in her symptoms. When seen recently by her PCP she was complaining of sharp pains in her upper abdomen and abdominal burning. She says she has had this periodically over the years, worse now if she consumes Pasta etc. She says she had not been following a strict gluten-free diet for the past several months but when symptoms flared she has now been gluten-free for 3-4 weeks she not had any weight loss. Has no complaints of changes in bowel habits diarrhea melena or hematochezia. She says she's never had issues with diarrhea. Family history is negative for celiac disease positive for father with colon polyps. She had labs drawn by primary care with TTG antibody normal at 4 and TTG antibody IgA slightly elevated at 6.   Review of Systems Pertinent positive and negative review of systems were noted in the above HPI section.  All other review of systems was otherwise negative.  Outpatient Encounter Prescriptions as of 06/27/2017  Medication Sig  . levothyroxine (SYNTHROID) 25 MCG tablet Take 1 tablet (25 mcg total) by mouth daily before breakfast.  . [DISCONTINUED] dicyclomine (BENTYL) 10 MG capsule Take 1 capsule (10 mg total) by mouth 4 (four) times  daily -  before meals and at bedtime.   No facility-administered encounter medications on file as of 06/27/2017.    Allergies  Allergen Reactions  . Codeine   . Erythromycin    Patient Active Problem List   Diagnosis Date Noted  . Elevated anti-tissue transglutaminase (tTG) IgA level 05/27/2017  . Hypothyroidism due to Hashimoto's thyroiditis 05/22/2017  . Family history of celiac sprue 05/22/2017  . Generalized abdominal pain 03/16/2011   Social History   Social History  . Marital status: Single    Spouse name: Doren Custard  . Number of children: 1  . Years of education: 33   Occupational History  . [employer] support     [employer]   Social History Main Topics  . Smoking status: Never Smoker  . Smokeless tobacco: Never Used  . Alcohol use 1.8 oz/week    3 Cans of beer per week  . Drug use: No  . Sexual activity: Yes    Partners: Male    Birth control/ protection: None   Other Topics Concern  . Not on file   Social History Narrative  . No narrative on file    Ms. Mccaskill's family history includes Celiac disease in her brother; Diabetes in her mother; Thyroid disease in her sister.      Objective:    Vitals:   06/27/17 0844  BP: 100/68  Pulse: 64    Physical Exam well-developed white female in no acute distress, blood pressure 100/68 pulse 64, height 5 foot 5 weight 140 BMI 23.3. HEENT ;nontraumatic  cephalic EOMI PERRLA sclerae anicteric, Cardiovascular; regular rate and rhythm with S1-S2 no murmur or gallop, capillary clear bilaterally, Abdomen ;soft no focal tenderness no guarding or rebound no palpable mass or hepatosplenomegaly bowel sounds are present, Rectal ;exam not done, EXt; no clubbing cyanosis or edema skin warm and dry, Neuropsych ;mood and affect appropriate       Assessment & Plan:   #56 51 year old female with history of IBS and significant gluten sensitivity referred for a slightly elevated TTG antibody IgA. Rule out gluten sensitivity versus celiac  disease  #2 colon cancer surveillance-patient had 1 prior colonoscopy 12-13 years ago in [place] by patient report negative #3 hypothyroidism #4 Raynauds  Plan; options were discussed with the patient. We discussed pursuing expanded celiac panel, and/or EGD with small bowel biopsy. She was advised that she should continue to consume gluten during testing . She does not want to pursue endoscopy at this time, and doesn't want to pursue further labs. She says she feels better off gluten and does not want to restart for fear of exacerbating her symptoms. We also discussed colonoscopy for surveillance which she should have at age 37. She does not wish to pursue colonoscopy at this time. She would like to think about their options and call back if she decides to proceed. Patient will be established with Dr. Carlean Purl Greater than 50% of the visit spent in education, and counseling  Petrice Beedy Genia Harold PA-C 06/28/2017   Cc: Ottis Stain*   Agree with Ms. Genia Harold assessment and plan. Gatha Mayer, MD, [facility]

## 2017-07-12 ENCOUNTER — Encounter: Payer: Self-pay | Admitting: Obstetrics & Gynecology

## 2017-07-22 ENCOUNTER — Other Ambulatory Visit: Payer: Self-pay | Admitting: *Deleted

## 2017-07-22 ENCOUNTER — Encounter: Payer: Self-pay | Admitting: *Deleted

## 2017-07-22 DIAGNOSIS — N939 Abnormal uterine and vaginal bleeding, unspecified: Secondary | ICD-10-CM

## 2017-07-23 ENCOUNTER — Telehealth: Payer: Self-pay | Admitting: Physician Assistant

## 2017-07-23 DIAGNOSIS — E038 Other specified hypothyroidism: Principal | ICD-10-CM

## 2017-07-23 DIAGNOSIS — E065 Other chronic thyroiditis: Secondary | ICD-10-CM

## 2017-07-23 DIAGNOSIS — Z1159 Encounter for screening for other viral diseases: Secondary | ICD-10-CM

## 2017-07-23 NOTE — Telephone Encounter (Signed)
See note below.  Orders are pending. Please see if they are correct. -EH/RMA

## 2017-07-23 NOTE — Telephone Encounter (Signed)
Orders signed Please clarify that she wanted the Hepatitis C screening and not a Hepatitis B titer (which would be to show immunity/previous vaccination) Also remind patient she is due for her mammogram and she just needs to schedule it [phone]

## 2017-07-23 NOTE — Telephone Encounter (Signed)
Patient called left vm requesting lab order in order to get thyroid med and on my chart she got a notification that she should get a Hep B and wanted to include that in blood work. Would like to go Oct 1st please call to adv lab order is sent. Thanks

## 2017-07-24 NOTE — Telephone Encounter (Signed)
Recommendations left on vm -EH/RMA  

## 2017-07-25 ENCOUNTER — Other Ambulatory Visit: Payer: Self-pay | Admitting: *Deleted

## 2017-07-25 DIAGNOSIS — E065 Other chronic thyroiditis: Secondary | ICD-10-CM

## 2017-07-25 DIAGNOSIS — Z1159 Encounter for screening for other viral diseases: Secondary | ICD-10-CM

## 2017-07-25 DIAGNOSIS — E038 Other specified hypothyroidism: Principal | ICD-10-CM

## 2017-07-27 LAB — TSH: TSH: 2.72 mIU/L

## 2017-07-27 LAB — HEPATITIS C ANTIBODY
Hepatitis C Ab: NONREACTIVE
SIGNAL TO CUT-OFF: 0.03 (ref <1.00)

## 2017-07-29 NOTE — Progress Notes (Signed)
Good morning [name],  Your thyroid function is normal.  Your hepatitis c screening test was negative.  Best, Evlyn Clines

## 2017-08-01 ENCOUNTER — Telehealth: Payer: Self-pay | Admitting: *Deleted

## 2017-08-01 NOTE — Telephone Encounter (Signed)
Email sent via Epic letting Nelson Chimes, [place] aware of labs that Dr Gala Romney had srawn on a mutual pt in July of 2018

## 2017-08-01 NOTE — Telephone Encounter (Signed)
-----   Message from Guss Bunde, MD sent at 07/28/2017 12:47 PM EDT ----- Can you make sure her PCP saw these labs.  Make note in Epic.

## 2017-08-12 ENCOUNTER — Other Ambulatory Visit: Payer: BLUE CROSS/BLUE SHIELD

## 2017-08-12 ENCOUNTER — Other Ambulatory Visit: Payer: BLUE CROSS/BLUE SHIELD | Admitting: Obstetrics & Gynecology

## 2017-08-29 ENCOUNTER — Ambulatory Visit (INDEPENDENT_AMBULATORY_CARE_PROVIDER_SITE_OTHER): Payer: BLUE CROSS/BLUE SHIELD

## 2017-08-29 ENCOUNTER — Ambulatory Visit (INDEPENDENT_AMBULATORY_CARE_PROVIDER_SITE_OTHER): Payer: BLUE CROSS/BLUE SHIELD | Admitting: Obstetrics & Gynecology

## 2017-08-29 ENCOUNTER — Encounter: Payer: Self-pay | Admitting: Obstetrics & Gynecology

## 2017-08-29 VITALS — BP 112/66 | HR 63 | Resp 16 | Ht 65.0 in | Wt 133.0 lb

## 2017-08-29 DIAGNOSIS — R9389 Abnormal findings on diagnostic imaging of other specified body structures: Secondary | ICD-10-CM

## 2017-08-29 DIAGNOSIS — N926 Irregular menstruation, unspecified: Secondary | ICD-10-CM

## 2017-08-29 DIAGNOSIS — Z3202 Encounter for pregnancy test, result negative: Secondary | ICD-10-CM

## 2017-08-29 DIAGNOSIS — N939 Abnormal uterine and vaginal bleeding, unspecified: Secondary | ICD-10-CM

## 2017-08-29 LAB — POCT URINE PREGNANCY: Preg Test, Ur: NEGATIVE

## 2017-08-29 NOTE — Progress Notes (Signed)
   Subjective:    Patient ID: [name], female    DOB: [birth year], 51 y.o.   MRN: [id number]  HPI 51 yo P0 here for a EMBX. She has a h/o irregular periods, may bleed for a week or even 2 weeks per month. She had a gyn u/s done today and the results are not available. She wants to proceed with the biopsy prior to getting the u/s results.   Review of Systems     Objective:   Physical Exam Well nourished, well hydrated white female, no apparent distress Breathing, conversing, and ambulating normally  UPT negative, consent signed, time out done Cervix prepped with betadine and grasped with a single tooth tenaculum Uterus sounded to 9 cm Pipelle used for 1 pass with a moderate amount of tissue obtained. She tolerated the procedure well.      Assessment & Plan:  DUB- await pathology and u/s results RTC 1 week

## 2017-09-02 ENCOUNTER — Telehealth: Payer: Self-pay

## 2017-09-02 NOTE — Telephone Encounter (Signed)
Called pt's phone to let her know about endometrial biopsy results and there was no answer. I left a message on the voicemail letting the pt know that I have results for her and provided her with the office's number to call us back

## 2017-09-03 ENCOUNTER — Telehealth: Payer: Self-pay

## 2017-09-03 ENCOUNTER — Encounter: Payer: Self-pay | Admitting: Physician Assistant

## 2017-09-03 DIAGNOSIS — E039 Hypothyroidism, unspecified: Secondary | ICD-10-CM

## 2017-09-03 MED ORDER — LEVOTHYROXINE SODIUM 25 MCG PO TABS: 25.0000 ug | ORAL_TABLET | Freq: Every day | ORAL | 0 refills | Status: DC

## 2017-09-03 NOTE — Telephone Encounter (Signed)
Spoke with pt and she is aware that the results from her endometrial biopsy came back as negative and that she needs a follow up appt with Hulan Fray to discuss plan (per Dr.Leggett). Appt was made.

## 2017-09-09 ENCOUNTER — Ambulatory Visit (INDEPENDENT_AMBULATORY_CARE_PROVIDER_SITE_OTHER): Payer: BLUE CROSS/BLUE SHIELD | Admitting: Physician Assistant

## 2017-09-09 ENCOUNTER — Encounter: Payer: Self-pay | Admitting: Physician Assistant

## 2017-09-09 VITALS — BP 119/78 | HR 76 | Wt 136.6 lb

## 2017-09-09 DIAGNOSIS — Z79899 Other long term (current) drug therapy: Secondary | ICD-10-CM

## 2017-09-09 DIAGNOSIS — Z9289 Personal history of other medical treatment: Secondary | ICD-10-CM

## 2017-09-09 DIAGNOSIS — N939 Abnormal uterine and vaginal bleeding, unspecified: Secondary | ICD-10-CM | POA: Insufficient documentation

## 2017-09-09 DIAGNOSIS — Z9889 Other specified postprocedural states: Secondary | ICD-10-CM | POA: Insufficient documentation

## 2017-09-09 DIAGNOSIS — E039 Hypothyroidism, unspecified: Secondary | ICD-10-CM

## 2017-09-09 HISTORY — DX: Abnormal uterine and vaginal bleeding, unspecified: N93.9

## 2017-09-09 NOTE — Patient Instructions (Addendum)
Please call [phone] to schedule your mammogram   Goiter A goiter is an enlarged thyroid gland. The thyroid gland is located in the lower front of the neck. The gland produces hormones that regulate mood, body temperature, pulse rate, and digestion. Most goiters are painless and are not a cause for serious concern. Goiters and conditions that cause goiters can be treated, if necessary. What are the causes? Causes of this condition include:  Diseases that attack healthy cells in your body (autoimmune diseases) and affect your thyroid function, such as: ? Graves disease. This causes too much thyroid hormone to be produced and it makes your thyroid overly active (hyperthyroidism). ? Hashimoto disease. This type of inflammation of the thyroid (thyroiditis) causes too little thyroid hormone to be produced and it makes your thyroid not active enough (hypothyroidism).  Other conditions that cause thyroiditis.  Nodular goiter. This means that there are one or more small growths on your thyroid. These can create too much thyroid hormone.  Pregnancy.  Thyroid cancer. This is rare.  Certain medicines.  Radiation exposure.  Iodine deficiency.  In some cases, the cause may not be known (idiopathic). What increases the risk? This condition is more likely to develop in:  People who have a family history of goiter.  Women.  People who do not get enough iodine in their diet.  People who are older than 51.  People who smoke tobacco.  What are the signs or symptoms? Common symptoms of this condition include:  Swelling in the lower part of the neck. This swelling can range from a very small bump to a large lump.  A tight feeling in the throat.  A hoarse voice.  Other symptoms include:  Coughing.  Wheezing.  Difficulty swallowing.  Difficulty breathing.  Bulging neck veins.  Dizziness.  In some cases, there are no symptoms and thyroid hormone levels may be normal. When  a goiter is the result of hyperthyroidism, symptoms may also include:  Nervousness or restlessness.  Inability to tolerate heat.  Unexplained weight loss.  Diarrhea.  Change in the texture of hair or skin.  Changes in heart beat, such as skipped beats, extra beats, or a rapid heart rate.  Loss of menstruation.  Shaky hands.  Increased appetite.  Sleep problems.  When a goiter is the result of hypothyroidism, symptoms may also include:  Feeling like you have no energy (lethargy).  Inability to tolerate cold.  Weight gain that is not explained by a change in diet or exercise habits.  Dry skin.  Coarse hair.  Menstrual irregularity.  Constipation.  Sadness or depression.  How is this diagnosed? This condition may be diagnosed with a medical history and physical exam. You may also have other tests, including:  Blood tests to check thyroid function.  Imaging tests, such as: ? Ultrasonography. ? CT scan. ? MRI. ? Thyroid scan. You will be given a safe radioactive injection, then images will be taken of your thyroid.  Tissue sample (biopsy) of the goiter or any nodules. This checks to see if the goiter or nodules are cancerous.  How is this treated? Treatment for this condition depends on the cause. Treatment may include:  Medicines to control your thyroid.  Anti-inflammatory or steroid medicines, if inflammation is the cause.  Iodine supplements or changes in diet, if the goiter is caused by iodine deficiency.  Radiation therapy.  Surgery to remove your thyroid.  In some cases, no treatment is necessary, and your health care provider will monitor  your condition at regular checkups. Follow these instructions at home:  Follow recommendations from your health care provider for any changes to your diet.  Take over-the-counter and prescription medicines only as told by your health care provider.  Do not use any tobacco products, including cigarettes,  chewing tobacco, or e-cigarettes. If you need help quitting, ask your health care provider.  Keep all follow-up appointments as told by your health care provider. This is important. Contact a health care provider if:  Your symptoms do not get better with treatment. Get help right away if:  You develop sudden, unexplained confusion or other mental changes.  You have nausea, vomiting, or diarrhea.  You develop a fever.  Your skin or the whites of your eyes appear yellow (jaundice).  You develop chest pain.  You have trouble breathing or swallowing.  You suddenly become very weak.  You experience extreme restlessness. This information is not intended to replace advice given to you by your health care provider. Make sure you discuss any questions you have with your health care provider. Document Released: 04/11/2010 Document Revised: 05/11/2016 Document Reviewed: 10/18/2014 Elsevier Interactive Patient Education  [employer].

## 2017-09-09 NOTE — Progress Notes (Signed)
HPI:                                                                [name] is a 51 y.o. female who presents to [place]: [place] today for medication management  Hypothyroidism: taking Synthroid daily. Compliant with medications. No adverse effects. Denies symptoms of hypo/hyperthyroidism. Fatigue has improved.  She recently had an endometrial biopsy for AUB with Dr. Hulan Fray showing polypoid secretory endometrium; no hyperplasia, atypia or malignancy.  Past Medical History:  Diagnosis Date  . Abnormal pap   . Hiatal hernia   . Hyperlipidemia   . Hypothyroidism due to Hashimoto's thyroiditis 05/22/2017  . IBS (irritable bowel syndrome)   . LGSIL on Pap smear of cervix 2012  . Metatarsalgia of left foot   . Neuritis    Past Surgical History:  Procedure Laterality Date  . ENDOMETRIAL BIOPSY    . microdiskectomy  2007   L spine, done in [place]  . PLACEMENT OF BREAST IMPLANTS    . TONSILLECTOMY    . UPPER GI ENDOSCOPY  2008  . WISDOM TOOTH EXTRACTION     Social History   Tobacco Use  . Smoking status: Never Smoker  . Smokeless tobacco: Never Used  Substance Use Topics  . Alcohol use: Yes    Alcohol/week: 1.8 oz    Types: 3 Cans of beer per week   family history includes Celiac disease in her brother; Diabetes in her mother; Thyroid disease in her sister.  ROS: negative except as noted in the HPI  Medications: Current Outpatient Medications  Medication Sig Dispense Refill  . dicyclomine (BENTYL) 10 MG capsule   1  . levothyroxine (SYNTHROID) 25 MCG tablet Take 1 tablet (25 mcg total) by mouth daily before breakfast. 90 tablet 0   No current facility-administered medications for this visit.    Allergies  Allergen Reactions  . Codeine   . Erythromycin        Objective:  BP 119/78   Pulse 76   Wt 136 lb 9.6 oz (62 kg)   LMP 08/15/2017   SpO2 98%   BMI 22.73 kg/m  Gen:  alert, not ill-appearing, no distress,  appropriate for age 51: head normocephalic without obvious abnormality, conjunctiva and cornea clear, trachea midline Pulm: Normal work of breathing, normal phonation Neuro: alert and oriented x 3, no tremor MSK: extremities atraumatic, normal gait and station Skin: intact, no rashes on exposed skin, no jaundice, no cyanosis Psych: well-groomed, cooperative, good eye contact, euthymic mood, affect mood-congruent, speech is articulate, and thought processes clear and goal-directed     No results found for this or any previous visit (from the past 72 hour(s)). No results found.    Assessment and Plan: 51 y.o. female with   1. Encounter for long-term current use of medication - TSH; Future  2. Hypothyroidism, unspecified type Lab Results  Component Value Date   TSH 2.72 07/26/2017  - continue Levothyroxine 25 mcg. Recheck in 6 months - TSH; Future   Patient education and anticipatory guidance given Patient agrees with treatment plan Follow-up in 6 months or sooner as needed if symptoms worsen or fail to improve  Darlyne Russian PA-C

## 2017-09-19 ENCOUNTER — Ambulatory Visit (INDEPENDENT_AMBULATORY_CARE_PROVIDER_SITE_OTHER): Payer: BLUE CROSS/BLUE SHIELD | Admitting: Obstetrics & Gynecology

## 2017-09-19 ENCOUNTER — Encounter: Payer: Self-pay | Admitting: Obstetrics & Gynecology

## 2017-09-19 VITALS — BP 112/67 | HR 84 | Resp 16 | Ht 65.0 in | Wt 133.0 lb

## 2017-09-19 DIAGNOSIS — N926 Irregular menstruation, unspecified: Secondary | ICD-10-CM

## 2017-09-19 MED ORDER — MISOPROSTOL 200 MCG PO TABS: ORAL_TABLET | ORAL | 0 refills | Status: DC

## 2017-09-19 NOTE — Progress Notes (Signed)
   Subjective:    Patient ID: [name], female    DOB: [birth year], 51 y.o.   MRN: [id number]  HPI 51 yo DW P0 here to discuss her EMBX and u/s results. She has irregular periods, may bleed for a week or even 2 weeks. This has been going on for about 6 months. Pain is not a big issue for her.   Review of Systems She uses condoms.    Objective:   Physical Exam Breathing, conversing, and ambulating normally Well nourished, well hydrated White female, no apparent distress  IMPRESSION: Mildly inhomogeneous and slightly thickened anterior myometrium of the uterus with questionable mass effect upon the endometrial complex and adjacent poorly defined basal layer of the endometrial complex, cannot exclude adenomyosis.  Small amount of nonspecific free pelvic fluid.  Remainder of exam unremarkable.     Assessment & Plan:  Plan for HTA with d&c pretreat with cytotec I sent an email to [facility] to schedule this.

## 2017-09-20 ENCOUNTER — Encounter (HOSPITAL_COMMUNITY): Payer: Self-pay

## 2017-11-07 ENCOUNTER — Encounter (HOSPITAL_BASED_OUTPATIENT_CLINIC_OR_DEPARTMENT_OTHER): Payer: Self-pay | Admitting: *Deleted

## 2017-11-11 ENCOUNTER — Encounter (HOSPITAL_BASED_OUTPATIENT_CLINIC_OR_DEPARTMENT_OTHER)
Admission: RE | Admit: 2017-11-11 | Discharge: 2017-11-11 | Disposition: A | Discharge status: Home / Self Care | Payer: Managed Care, Other (non HMO) | Source: Ambulatory Visit | Attending: Obstetrics & Gynecology | Admitting: Obstetrics & Gynecology

## 2017-11-11 DIAGNOSIS — N938 Other specified abnormal uterine and vaginal bleeding: Secondary | ICD-10-CM | POA: Diagnosis present

## 2017-11-11 DIAGNOSIS — Z9889 Other specified postprocedural states: Secondary | ICD-10-CM | POA: Diagnosis not present

## 2017-11-11 DIAGNOSIS — Z79890 Hormone replacement therapy (postmenopausal): Secondary | ICD-10-CM | POA: Diagnosis not present

## 2017-11-11 DIAGNOSIS — N840 Polyp of corpus uteri: Secondary | ICD-10-CM | POA: Diagnosis not present

## 2017-11-11 DIAGNOSIS — E063 Autoimmune thyroiditis: Secondary | ICD-10-CM | POA: Diagnosis not present

## 2017-11-11 DIAGNOSIS — K449 Diaphragmatic hernia without obstruction or gangrene: Secondary | ICD-10-CM | POA: Diagnosis not present

## 2017-11-11 DIAGNOSIS — Z8349 Family history of other endocrine, nutritional and metabolic diseases: Secondary | ICD-10-CM | POA: Diagnosis not present

## 2017-11-11 DIAGNOSIS — Z833 Family history of diabetes mellitus: Secondary | ICD-10-CM | POA: Diagnosis not present

## 2017-11-11 DIAGNOSIS — Z8379 Family history of other diseases of the digestive system: Secondary | ICD-10-CM | POA: Diagnosis not present

## 2017-11-11 LAB — CBC
HCT: 42.4 % (ref 36.0–46.0)
Hemoglobin: 13.8 g/dL (ref 12.0–15.0)
MCH: 30.7 pg (ref 26.0–34.0)
MCHC: 32.5 g/dL (ref 30.0–36.0)
MCV: 94.2 fL (ref 78.0–100.0)
Platelets: 203 10*3/uL (ref 150–400)
RBC: 4.5 MIL/uL (ref 3.87–5.11)
RDW: 13 % (ref 11.5–15.5)
WBC: 6.1 10*3/uL (ref 4.0–10.5)

## 2017-11-11 LAB — POCT PREGNANCY, URINE: Preg Test, Ur: NEGATIVE

## 2017-11-13 ENCOUNTER — Encounter (HOSPITAL_BASED_OUTPATIENT_CLINIC_OR_DEPARTMENT_OTHER)
Admission: RE | Disposition: A | Discharge status: Home / Self Care | Payer: Self-pay | Source: Ambulatory Visit | Attending: Obstetrics & Gynecology

## 2017-11-13 ENCOUNTER — Ambulatory Visit (HOSPITAL_BASED_OUTPATIENT_CLINIC_OR_DEPARTMENT_OTHER): Payer: Managed Care, Other (non HMO) | Admitting: Anesthesiology

## 2017-11-13 ENCOUNTER — Other Ambulatory Visit: Payer: Self-pay

## 2017-11-13 ENCOUNTER — Encounter (HOSPITAL_BASED_OUTPATIENT_CLINIC_OR_DEPARTMENT_OTHER): Payer: Self-pay | Admitting: *Deleted

## 2017-11-13 ENCOUNTER — Ambulatory Visit (HOSPITAL_BASED_OUTPATIENT_CLINIC_OR_DEPARTMENT_OTHER)
Admission: RE | Admit: 2017-11-13 | Discharge: 2017-11-13 | Disposition: A | Discharge status: Home / Self Care | Payer: Managed Care, Other (non HMO) | Source: Ambulatory Visit | Attending: Obstetrics & Gynecology | Admitting: Obstetrics & Gynecology

## 2017-11-13 DIAGNOSIS — K449 Diaphragmatic hernia without obstruction or gangrene: Secondary | ICD-10-CM | POA: Insufficient documentation

## 2017-11-13 DIAGNOSIS — Z8379 Family history of other diseases of the digestive system: Secondary | ICD-10-CM | POA: Insufficient documentation

## 2017-11-13 DIAGNOSIS — N840 Polyp of corpus uteri: Secondary | ICD-10-CM | POA: Diagnosis not present

## 2017-11-13 DIAGNOSIS — N938 Other specified abnormal uterine and vaginal bleeding: Secondary | ICD-10-CM | POA: Diagnosis not present

## 2017-11-13 DIAGNOSIS — Z9889 Other specified postprocedural states: Secondary | ICD-10-CM | POA: Insufficient documentation

## 2017-11-13 DIAGNOSIS — Z8349 Family history of other endocrine, nutritional and metabolic diseases: Secondary | ICD-10-CM | POA: Insufficient documentation

## 2017-11-13 DIAGNOSIS — E063 Autoimmune thyroiditis: Secondary | ICD-10-CM | POA: Insufficient documentation

## 2017-11-13 DIAGNOSIS — Z79890 Hormone replacement therapy (postmenopausal): Secondary | ICD-10-CM | POA: Insufficient documentation

## 2017-11-13 DIAGNOSIS — Z833 Family history of diabetes mellitus: Secondary | ICD-10-CM | POA: Insufficient documentation

## 2017-11-13 HISTORY — DX: Nausea with vomiting, unspecified: R11.2

## 2017-11-13 HISTORY — DX: Personal history of other diseases of the digestive system: Z87.19

## 2017-11-13 HISTORY — PX: DILITATION & CURRETTAGE/HYSTROSCOPY WITH HYDROTHERMAL ABLATION: SHX5570

## 2017-11-13 HISTORY — DX: Other specified postprocedural states: Z98.890

## 2017-11-13 SURGERY — DILATATION & CURETTAGE/HYSTEROSCOPY WITH HYDROTHERMAL ABLATION
Anesthesia: General | Site: Uterus

## 2017-11-13 MED ORDER — MIDAZOLAM HCL 2 MG/2ML IJ SOLN
1.0000 mg | INTRAMUSCULAR | Status: DC | PRN
  Administered 2017-11-13 (×2): 2 mg via INTRAVENOUS

## 2017-11-13 MED ORDER — LIDOCAINE 2% (20 MG/ML) 5 ML SYRINGE
INTRAMUSCULAR | Status: AC
  Filled 2017-11-13: qty 5

## 2017-11-13 MED ORDER — IBUPROFEN 600 MG PO TABS: 600.0000 mg | ORAL_TABLET | Freq: Four times a day (QID) | ORAL | 1 refills | Status: DC | PRN

## 2017-11-13 MED ORDER — ONDANSETRON HCL 4 MG/2ML IJ SOLN
INTRAMUSCULAR | Status: DC | PRN
  Administered 2017-11-13: 4 mg via INTRAVENOUS

## 2017-11-13 MED ORDER — SCOPOLAMINE 1 MG/3DAYS TD PT72: 1.0000 | MEDICATED_PATCH | Freq: Once | TRANSDERMAL | Status: DC | PRN

## 2017-11-13 MED ORDER — PROPOFOL 10 MG/ML IV BOLUS
INTRAVENOUS | Status: DC | PRN
  Administered 2017-11-13: 100 mg via INTRAVENOUS

## 2017-11-13 MED ORDER — PROMETHAZINE HCL 25 MG/ML IJ SOLN: 6.2500 mg | INTRAMUSCULAR | Status: DC | PRN

## 2017-11-13 MED ORDER — DEXAMETHASONE SODIUM PHOSPHATE 4 MG/ML IJ SOLN
INTRAMUSCULAR | Status: DC | PRN
  Administered 2017-11-13: 10 mg via INTRAVENOUS

## 2017-11-13 MED ORDER — ROCURONIUM BROMIDE 10 MG/ML (PF) SYRINGE
PREFILLED_SYRINGE | INTRAVENOUS | Status: AC
  Filled 2017-11-13: qty 5

## 2017-11-13 MED ORDER — FENTANYL CITRATE (PF) 100 MCG/2ML IJ SOLN
INTRAMUSCULAR | Status: AC
  Filled 2017-11-13: qty 2

## 2017-11-13 MED ORDER — LACTATED RINGERS IV SOLN
INTRAVENOUS | Status: DC
  Administered 2017-11-13: 09:00:00 via INTRAVENOUS

## 2017-11-13 MED ORDER — ONDANSETRON HCL 4 MG/2ML IJ SOLN
INTRAMUSCULAR | Status: AC
  Filled 2017-11-13: qty 2

## 2017-11-13 MED ORDER — PROPOFOL 500 MG/50ML IV EMUL
INTRAVENOUS | Status: DC | PRN
  Administered 2017-11-13: 150 ug/kg/min via INTRAVENOUS
  Administered 2017-11-13: 10:00:00 via INTRAVENOUS

## 2017-11-13 MED ORDER — PHENYLEPHRINE HCL 10 MG/ML IJ SOLN
INTRAMUSCULAR | Status: DC | PRN
  Administered 2017-11-13: 120 ug via INTRAVENOUS

## 2017-11-13 MED ORDER — OXYCODONE-ACETAMINOPHEN 5-325 MG PO TABS: 1.0000 | ORAL_TABLET | Freq: Four times a day (QID) | ORAL | 0 refills | Status: DC | PRN

## 2017-11-13 MED ORDER — BUPIVACAINE HCL (PF) 0.5 % IJ SOLN
INTRAMUSCULAR | Status: DC | PRN
  Administered 2017-11-13: 10 mL

## 2017-11-13 MED ORDER — LIDOCAINE HCL (CARDIAC) 20 MG/ML IV SOLN
INTRAVENOUS | Status: DC | PRN
  Administered 2017-11-13: 50 mg via INTRAVENOUS

## 2017-11-13 MED ORDER — SCOPOLAMINE 1 MG/3DAYS TD PT72
1.0000 | MEDICATED_PATCH | Freq: Once | TRANSDERMAL | Status: DC
  Administered 2017-11-13: 1.5 mg via TRANSDERMAL

## 2017-11-13 MED ORDER — KETOROLAC TROMETHAMINE 30 MG/ML IJ SOLN
INTRAMUSCULAR | Status: DC | PRN
  Administered 2017-11-13: 30 mg via INTRAVENOUS

## 2017-11-13 MED ORDER — DEXAMETHASONE SODIUM PHOSPHATE 10 MG/ML IJ SOLN
INTRAMUSCULAR | Status: AC
  Filled 2017-11-13: qty 1

## 2017-11-13 MED ORDER — SCOPOLAMINE 1 MG/3DAYS TD PT72
MEDICATED_PATCH | TRANSDERMAL | Status: AC
  Filled 2017-11-13: qty 1

## 2017-11-13 MED ORDER — FENTANYL CITRATE (PF) 100 MCG/2ML IJ SOLN: 25.0000 ug | INTRAMUSCULAR | Status: DC | PRN

## 2017-11-13 MED ORDER — FENTANYL CITRATE (PF) 100 MCG/2ML IJ SOLN
50.0000 ug | INTRAMUSCULAR | Status: DC | PRN
  Administered 2017-11-13: 50 ug via INTRAVENOUS

## 2017-11-13 MED ORDER — MIDAZOLAM HCL 2 MG/2ML IJ SOLN
INTRAMUSCULAR | Status: AC
  Filled 2017-11-13: qty 2

## 2017-11-13 MED ORDER — SODIUM CHLORIDE 0.9 % IR SOLN
Status: DC | PRN
Start: 2017-11-13 — End: 2017-11-13
  Administered 2017-11-13: 1000 mL

## 2017-11-13 MED ORDER — PROPOFOL 10 MG/ML IV BOLUS
INTRAVENOUS | Status: AC
  Filled 2017-11-13: qty 20

## 2017-11-13 SURGICAL SUPPLY — 23 items
BRIEF STRETCH FOR OB PAD XXL (UNDERPADS AND DIAPERS) ×3 IMPLANT
CANISTER SUCT 3000ML PPV (MISCELLANEOUS) ×3 IMPLANT
CLOTH BEACON ORANGE TIMEOUT ST (SAFETY) IMPLANT
CONTAINER PREFILL 10% NBF 60ML (FORM) ×3 IMPLANT
DILATOR CANAL MILEX (MISCELLANEOUS) IMPLANT
ELECT REM PT RETURN 9FT ADLT (ELECTROSURGICAL)
ELECTRODE REM PT RTRN 9FT ADLT (ELECTROSURGICAL) IMPLANT
GLOVE BIO SURGEON STRL SZ 6.5 (GLOVE) ×2 IMPLANT
GLOVE BIO SURGEONS STRL SZ 6.5 (GLOVE) ×1
GLOVE BIOGEL PI IND STRL 7.0 (GLOVE) ×1 IMPLANT
GLOVE BIOGEL PI INDICATOR 7.0 (GLOVE) ×2
GLOVE ECLIPSE 6.5 STRL STRAW (GLOVE) ×3 IMPLANT
GOWN STRL REUS W/ TWL XL LVL3 (GOWN DISPOSABLE) ×1 IMPLANT
GOWN STRL REUS W/TWL LRG LVL3 (GOWN DISPOSABLE) ×3 IMPLANT
GOWN STRL REUS W/TWL XL LVL3 (GOWN DISPOSABLE) ×2
NEEDLE SPNL 18GX3.5 QUINCKE PK (NEEDLE) ×3 IMPLANT
PACK VAGINAL MINOR WOMEN LF (CUSTOM PROCEDURE TRAY) ×3 IMPLANT
PAD OB MATERNITY 4.3X12.25 (PERSONAL CARE ITEMS) ×3 IMPLANT
PAD PREP 24X48 CUFFED NSTRL (MISCELLANEOUS) ×3 IMPLANT
SET GENESYS HTA PROCERVA (MISCELLANEOUS) IMPLANT
SLEEVE SCD COMPRESS KNEE MED (MISCELLANEOUS) ×3 IMPLANT
SYR 30ML LL (SYRINGE) ×3 IMPLANT
TOWEL OR 17X24 6PK STRL BLUE (TOWEL DISPOSABLE) ×6 IMPLANT

## 2017-11-13 NOTE — Anesthesia Procedure Notes (Signed)
Procedure Name: LMA Insertion Performed by: Verita Lamb, CRNA Pre-anesthesia Checklist: Patient identified, Suction available, Emergency Drugs available, Patient being monitored and Timeout performed Preoxygenation: Pre-oxygenation with 100% oxygen Induction Type: IV induction Ventilation: Mask ventilation without difficulty LMA: LMA inserted LMA Size: 3.0 Placement Confirmation: CO2 detector,  positive ETCO2 and breath sounds checked- equal and bilateral Tube secured with: Tape Dental Injury: Teeth and Oropharynx as per pre-operative assessment

## 2017-11-13 NOTE — H&P (Signed)
[name] is an 52 yo DW P0 here to have a HTA and d&c. She has irregular periods, may bleed for a week or even 2 weeks. This has been going on for about 6 months. Pain is not a big issue for her.   Review of Systems She uses condoms.    Objective:   Physical Exam Breathing, conversing, and ambulating normally Well nourished, well hydrated White female, no apparent distress  IMPRESSION: Mildly inhomogeneous and slightly thickened anterior myometrium of the uterus with questionable mass effect upon the endometrial complex and adjacent poorly defined basal layer of the endometrial complex, cannot exclude adenomyosis.  Small amount of nonspecific free pelvic fluid.  Remainder of exam unremarkable.      Patient's last menstrual period was 11/01/2017 (exact date).    Past Medical History:  Diagnosis Date  . Abnormal pap   . Abnormal uterine bleeding (AUB) 09/09/2017  . History of hiatal hernia   . Hyperlipidemia   . Hypothyroidism due to Hashimoto's thyroiditis 05/22/2017  . IBS (irritable bowel syndrome)   . LGSIL on Pap smear of cervix 2012  . Metatarsalgia of left foot   . Neuritis   . PONV (postoperative nausea and vomiting)     Past Surgical History:  Procedure Laterality Date  . ENDOMETRIAL BIOPSY    . microdiskectomy  2007   L spine, done in [place]  . PLACEMENT OF BREAST IMPLANTS    . TONSILLECTOMY    . UPPER GI ENDOSCOPY  2008  . WISDOM TOOTH EXTRACTION      Family History  Problem Relation Age of Onset  . Diabetes Mother   . Thyroid disease Sister   . Celiac disease Brother     Social History:  reports that  has never smoked. she has never used smokeless tobacco. She reports that she drinks about 1.8 oz of alcohol per week. She reports that she does not use drugs.  Allergies:  Allergies  Allergen Reactions  . Codeine   . Erythromycin     Medications Prior to Admission  Medication Sig Dispense Refill Last Dose  . levothyroxine (SYNTHROID) 25  MCG tablet Take 1 tablet (25 mcg total) by mouth daily before breakfast. 90 tablet 0 11/07/2017 at Unknown time  . misoprostol (CYTOTEC) 200 MCG tablet Take 3 pills by mouth the night before procedure. 3 tablet 0     ROS  She does not want kids, uses condoms faithfully. Works for [employer] 5\' 5"  (1.651 m), weight 56.7 kg (125 lb), last menstrual period 11/01/2017. Physical Exam  No results found for this or any previous visit (from the past 24 hour(s)).  No results found.  Assessment/Plan: DUB- plan for HTA, d&c.  She understands the risks of surgery, including, but not to infection, bleeding, DVTs, damage to bowel, bladder, ureters. She wishes to proceed.     Seena Ritacco C Lilybelle Mayeda 11/13/2017, 8:16 AM

## 2017-11-13 NOTE — Discharge Instructions (Signed)
Dilation and Curettage or Vacuum Curettage, Care After °This sheet gives you information about how to care for yourself after your procedure. Your health care provider may also give you more specific instructions. If you have problems or questions, contact your health care provider. °What can I expect after the procedure? °After your procedure, it is common to have: °· Mild pain or cramping. °· Some vaginal bleeding or spotting. ° °These may last for up to 2 weeks after your procedure. °Follow these instructions at home: °Activity ° °· Do not drive or use heavy machinery while taking prescription pain medicine. °· Avoid driving for the first 24 hours after your procedure. °· Take frequent, short walks, followed by rest periods, throughout the day. Ask your health care provider what activities are safe for you. After 1-2 days, you may be able to return to your normal activities. °· Do not lift anything heavier than 10 lb (4.5 kg) until your health care provider approves. °· For at least 2 weeks, or as long as told by your health care provider, do not: °? Douche. °? Use tampons. °? Have sexual intercourse. °General instructions ° °· Take over-the-counter and prescription medicines only as told by your health care provider. This is especially important if you take blood thinning medicine. °· Do not take baths, swim, or use a hot tub until your health care provider approves. Take showers instead of baths. °· Wear compression stockings as told by your health care provider. These stockings help to prevent blood clots and reduce swelling in your legs. °· It is your responsibility to get the results of your procedure. Ask your health care provider, or the department performing the procedure, when your results will be ready. °· Keep all follow-up visits as told by your health care provider. This is important. °Contact a health care provider if: °· You have severe cramps that get worse or that do not get better with  medicine. °· You have severe abdominal pain. °· You cannot drink fluids without vomiting. °· You develop pain in a different area of your pelvis. °· You have bad-smelling vaginal discharge. °· You have a rash. °Get help right away if: °· You have vaginal bleeding that soaks more than one sanitary pad in 1 hour, for 2 hours in a row. °· You pass large blood clots from your vagina. °· You have a fever that is above 100.4°F (38.0°C). °· Your abdomen feels very tender or hard. °· You have chest pain. °· You have shortness of breath. °· You cough up blood. °· You feel dizzy or light-headed. °· You faint. °· You have pain in your neck or shoulder area. °This information is not intended to replace advice given to you by your health care provider. Make sure you discuss any questions you have with your health care provider. °Document Released: 10/19/2000 Document Revised: 06/20/2016 Document Reviewed: 05/24/2016 °Elsevier Interactive Patient Education © 2018 [employer] ° ° °Post Anesthesia Home Care Instructions ° °Activity: °Get plenty of rest for the remainder of the day. A responsible individual must stay with you for 24 hours following the procedure.  °For the next 24 hours, DO NOT: °-Drive a car °-Operate machinery °-Drink alcoholic beverages °-Take any medication unless instructed by your physician °-Make any legal decisions or sign important papers. ° °Meals: °Start with liquid foods such as gelatin or soup. Progress to regular foods as tolerated. Avoid greasy, spicy, heavy foods. If nausea and/or vomiting occur, drink only clear liquids until the nausea   and/or vomiting subsides. Call your physician if vomiting continues. ° °Special Instructions/Symptoms: °Your throat may feel dry or sore from the anesthesia or the breathing tube placed in your throat during surgery. If this causes discomfort, gargle with warm salt water. The discomfort should disappear within 24 hours. ° °If you had a scopolamine patch placed  behind your ear for the management of post- operative nausea and/or vomiting: ° °1. The medication in the patch is effective for 72 hours, after which it should be removed.  Wrap patch in a tissue and discard in the trash. Wash hands thoroughly with soap and water. °2. You may remove the patch earlier than 72 hours if you experience unpleasant side effects which may include dry mouth, dizziness or visual disturbances. °3. Avoid touching the patch. Wash your hands with soap and water after contact with the patch. °  ° ° °

## 2017-11-13 NOTE — Transfer of Care (Signed)
Immediate Anesthesia Transfer of Care Note  Patient: [name]  Procedure(s) Performed: DILATATION & CURETTAGE/HYSTEROSCOPY WITH HYDROTHERMAL ABLATION (N/A Uterus)  Patient Location: PACU  Anesthesia Type:General  Level of Consciousness: awake, alert  and oriented  Airway & Oxygen Therapy: Patient Spontanous Breathing and Patient connected to face mask oxygen  Post-op Assessment: Report given to RN and Post -op Vital signs reviewed and stable  Post vital signs: Reviewed and stable  Last Vitals:  Vitals:   11/13/17 0828  BP: 107/67  Pulse: 61  Resp: 16  Temp: 36.8 C  SpO2: 100%    Last Pain:  Vitals:   11/13/17 0828  TempSrc: Oral  PainSc: 2       Patients Stated Pain Goal: 2 (63/84/53 [id number])  Complications: No apparent anesthesia complications

## 2017-11-13 NOTE — Op Note (Signed)
11/13/2017  10:19 AM  PATIENT:  [name]  52 y.o. female  PRE-OPERATIVE DIAGNOSIS:  Dysfunctional uterine bleeding  POST-OPERATIVE DIAGNOSIS:  Dysfunctional uterine bleeding  PROCEDURE:  Procedure(s): DILATATION & CURETTAGE/HYSTEROSCOPY WITH HYDROTHERMAL ABLATION (N/A)  SURGEON:  Surgeon(s) and Role:    * Nedda Gains, Wilhemina Cash, MD - Primary  ANESTHESIA:   local and general  EBL:  minimal   BLOOD ADMINISTERED:none  DRAINS: none   LOCAL MEDICATIONS USED:  MARCAINE     SPECIMEN:  Source of Specimen:  uterine curettings  DISPOSITION OF SPECIMEN:  PATHOLOGY  COUNTS:  YES  TOURNIQUET:  * No tourniquets in log *  DICTATION: .Dragon Dictation  PLAN OF CARE: Discharge to home after PACU  PATIENT DISPOSITION:  PACU - hemodynamically stable.   Delay start of Pharmacological VTE agent (>24hrs) due to surgical blood loss or risk of bleeding: not applicable  The risks, benefits, and alternatives of surgery were explained, understood, and accepted. All questions were answered. Consents were signed. In the operating room general anesthesia was applied without complication, and she was placed in the dorsal lithotomy position. Her vagina was prepped and draped in the usual sterile fashion.  A bimanual exam revealed a normal size and shape, anteverted mobile uterus. Her adnexa were nonenlarged. Her pubic arch is fair if she needs a vaginal hysterectomy in the future. She has a moderate amount of uterine prolapse. A speculum was placed and a single-tooth tenaculum was used to grasp the anterior lip of her cervix. A total of 20 mL of 0.5% Marcaine was used to perform a paracervical block. Her uterus sounded to 8 cm. Her cervix was carefully and slowly dilated to accommodate a small curette. A curettage was done in all quadrants and the fundus of the uterus. A moderate amount of  tissue was obtained. A gritty sensation was appreciated throughout. I then proceeded with the HTA procedure. Hysteroscopy  revealed a shaggy endometrium.   The ostia were both visualized. The procedure proceeded as expected. The HTA rep was present for the procedure. There was no bleeding noted at the end of the case. She was taken to the recovery room after being extubated. She tolerated the procedure well.

## 2017-11-13 NOTE — Anesthesia Postprocedure Evaluation (Signed)
Anesthesia Post Note  Patient: [name]  Procedure(s) Performed: DILATATION & CURETTAGE/HYSTEROSCOPY WITH HYDROTHERMAL ABLATION (N/A Uterus)     Patient location during evaluation: PACU Anesthesia Type: General Level of consciousness: awake and alert Pain management: pain level controlled Vital Signs Assessment: post-procedure vital signs reviewed and stable Respiratory status: spontaneous breathing, nonlabored ventilation and respiratory function stable Cardiovascular status: blood pressure returned to baseline and stable Postop Assessment: no apparent nausea or vomiting Anesthetic complications: no    Last Vitals:  Vitals:   11/13/17 1100 11/13/17 1115  BP: 108/86 109/67  Pulse: (!) 55 (!) 56  Resp: 18 16  Temp:  (!) 36.4 C  SpO2: 100% 99%    Last Pain:  Vitals:   11/13/17 1115  TempSrc:   PainSc: 2                  Catalina Gravel

## 2017-11-13 NOTE — Anesthesia Preprocedure Evaluation (Addendum)
Anesthesia Evaluation  Patient identified by MRN, date of birth, ID band Patient awake    Reviewed: Allergy & Precautions, NPO status , Patient's Chart, lab work & pertinent test results  History of Anesthesia Complications (+) PONV and history of anesthetic complications  Airway Mallampati: II  TM Distance: >3 FB Neck ROM: Full    Dental  (+) Teeth Intact, Dental Advisory Given   Pulmonary neg pulmonary ROS,    Pulmonary exam normal breath sounds clear to auscultation       Cardiovascular Exercise Tolerance: Good negative cardio ROS Normal cardiovascular exam Rhythm:Regular Rate:Normal     Neuro/Psych negative neurological ROS  negative psych ROS   GI/Hepatic negative GI ROS, Neg liver ROS,   Endo/Other  Hypothyroidism   Renal/GU negative Renal ROS     Musculoskeletal negative musculoskeletal ROS (+)   Abdominal   Peds  Hematology negative hematology ROS (+)   Anesthesia Other Findings Day of surgery medications reviewed with the patient.  Reproductive/Obstetrics AUB                            Anesthesia Physical Anesthesia Plan  ASA: II  Anesthesia Plan: General   Post-op Pain Management:    Induction: Intravenous  PONV Risk Score and Plan: 4 or greater and Dexamethasone, Ondansetron, Scopolamine patch - Pre-op, TIVA and Midazolam  Airway Management Planned: LMA  Additional Equipment:   Intra-op Plan:   Post-operative Plan: Extubation in OR  Informed Consent: I have reviewed the patients History and Physical, chart, labs and discussed the procedure including the risks, benefits and alternatives for the proposed anesthesia with the patient or authorized representative who has indicated his/her understanding and acceptance.   Dental advisory given  Plan Discussed with: CRNA  Anesthesia Plan Comments: (Risks/benefits of general anesthesia discussed with patient  including risk of damage to teeth, lips, gum, and tongue, nausea/vomiting, allergic reactions to medications, and the possibility of heart attack, stroke and death.  All patient questions answered.  Patient wishes to proceed.)       Anesthesia Quick Evaluation

## 2017-11-14 ENCOUNTER — Encounter (HOSPITAL_BASED_OUTPATIENT_CLINIC_OR_DEPARTMENT_OTHER): Payer: Self-pay | Admitting: Obstetrics & Gynecology

## 2017-12-04 ENCOUNTER — Other Ambulatory Visit: Payer: Self-pay | Admitting: Physician Assistant

## 2017-12-04 DIAGNOSIS — E039 Hypothyroidism, unspecified: Secondary | ICD-10-CM

## 2018-03-07 ENCOUNTER — Other Ambulatory Visit: Payer: Self-pay | Admitting: Physician Assistant

## 2018-03-07 DIAGNOSIS — E039 Hypothyroidism, unspecified: Secondary | ICD-10-CM

## 2018-06-05 ENCOUNTER — Other Ambulatory Visit: Payer: Self-pay | Admitting: Physician Assistant

## 2018-06-05 DIAGNOSIS — E039 Hypothyroidism, unspecified: Secondary | ICD-10-CM

## 2018-06-30 ENCOUNTER — Encounter: Payer: Self-pay | Admitting: Physician Assistant

## 2018-06-30 ENCOUNTER — Ambulatory Visit (INDEPENDENT_AMBULATORY_CARE_PROVIDER_SITE_OTHER): Payer: Managed Care, Other (non HMO) | Admitting: Physician Assistant

## 2018-06-30 VITALS — BP 111/71 | HR 62 | Wt 135.0 lb

## 2018-06-30 DIAGNOSIS — N912 Amenorrhea, unspecified: Secondary | ICD-10-CM

## 2018-06-30 DIAGNOSIS — E038 Other specified hypothyroidism: Secondary | ICD-10-CM | POA: Diagnosis not present

## 2018-06-30 DIAGNOSIS — N951 Menopausal and female climacteric states: Secondary | ICD-10-CM

## 2018-06-30 DIAGNOSIS — R61 Generalized hyperhidrosis: Secondary | ICD-10-CM

## 2018-06-30 DIAGNOSIS — Z1231 Encounter for screening mammogram for malignant neoplasm of breast: Secondary | ICD-10-CM

## 2018-06-30 DIAGNOSIS — Z5181 Encounter for therapeutic drug level monitoring: Secondary | ICD-10-CM

## 2018-06-30 DIAGNOSIS — Z131 Encounter for screening for diabetes mellitus: Secondary | ICD-10-CM

## 2018-06-30 DIAGNOSIS — Z9889 Other specified postprocedural states: Secondary | ICD-10-CM

## 2018-06-30 DIAGNOSIS — Z130 Encounter for screening for diseases of the blood and blood-forming organs and certain disorders involving the immune mechanism: Secondary | ICD-10-CM

## 2018-06-30 DIAGNOSIS — E063 Autoimmune thyroiditis: Secondary | ICD-10-CM

## 2018-06-30 NOTE — Progress Notes (Signed)
HPI:                                                                [name] is a 52 y.o. female who presents to [place]: [place] today for nightsweats  For the last 3 weeks patient has felt fatigued, increased anxiety, experiencing night sweats, and multiple nighttime awakenings. Reports she has never slept soundly through the night due to back pain and being a "poor sleeper." The sweating has exacerbated this. Denies drenching nightsweats, cough, dyspnea, fevers, chills, weight loss. Has also had some daytime flushing lasting several minutes 2-3 times per day. LMP was end of 2018, she had a D&C in January. Recently started taking Black Cohosh. Takes low-dose Synthroid 25 mcg for Hashimoto's disease and reports she is compliant with this Drinks 2 beers per day and has done so for years.  Depression screen [facility] 2/9 05/22/2017  Decreased Interest 0  Down, Depressed, Hopeless 0  PHQ - 2 Score 0    No flowsheet data found.    Past Medical History:  Diagnosis Date  . Abnormal pap   . Abnormal uterine bleeding (AUB) 09/09/2017  . History of hiatal hernia   . Hyperlipidemia   . Hypothyroidism due to Hashimoto's thyroiditis 05/22/2017  . IBS (irritable bowel syndrome)   . LGSIL on Pap smear of cervix 2012  . Metatarsalgia of left foot   . Neuritis   . PONV (postoperative nausea and vomiting)    Past Surgical History:  Procedure Laterality Date  . DILITATION & CURRETTAGE/HYSTROSCOPY WITH HYDROTHERMAL ABLATION N/A 11/13/2017   Procedure: DILATATION & CURETTAGE/HYSTEROSCOPY WITH HYDROTHERMAL ABLATION;  Surgeon: Yvonne Filbert, MD;  Location: [facility];  Service: Gynecology;  Laterality: N/A;  . ENDOMETRIAL BIOPSY    . microdiskectomy  2007   L spine, done in [place]  . PLACEMENT OF BREAST IMPLANTS    . TONSILLECTOMY    . UPPER GI ENDOSCOPY  2008  . WISDOM TOOTH EXTRACTION     Social History   Tobacco Use  . Smoking status:  Never Smoker  . Smokeless tobacco: Never Used  Substance Use Topics  . Alcohol use: Yes    Alcohol/week: 3.0 standard drinks    Types: 3 Cans of beer per week   family history includes Celiac disease in her brother; Diabetes in her mother; Thyroid disease in her sister.    ROS: negative except as noted in the HPI  Medications: Current Outpatient Medications  Medication Sig Dispense Refill  . levothyroxine (SYNTHROID, LEVOTHROID) 25 MCG tablet TAKE 1 TABLET(25 MCG) BY MOUTH DAILY BEFORE BREAKFAST 30 tablet 0  . ibuprofen (ADVIL,MOTRIN) 600 MG tablet Take 1 tablet (600 mg total) by mouth every 6 (six) hours as needed. 30 tablet 1  . misoprostol (CYTOTEC) 200 MCG tablet Take 3 pills by mouth the night before procedure. 3 tablet 0   No current facility-administered medications for this visit.    Allergies  Allergen Reactions  . Codeine   . Erythromycin        Objective:  BP 111/71   Pulse 62   Wt 135 lb (61.2 kg)   BMI 22.47 kg/m  Gen:  alert, not ill-appearing, no distress, appropriate for age 68: head normocephalic without  obvious abnormality, conjunctiva and cornea clear, trachea midline Pulm: Normal work of breathing, normal phonation Neuro: alert and oriented x 3, no tremor MSK: extremities atraumatic, normal gait and station Skin: intact, no rashes on exposed skin, no jaundice, no cyanosis Psych: well-groomed, cooperative, good eye contact, euthymic mood, affect mood-congruent, speech is articulate, and thought processes clear and goal-directed    No results found for this or any previous visit (from the past 72 hour(s)). No results found.    Assessment and Plan: 52 y.o. female with   .[name] was seen today for night sweats.  Diagnoses and all orders for this visit:  Menopausal vasomotor syndrome  Night sweats -     TSH -     FSH/LH -     Comprehensive metabolic panel -     Hemoglobin A1c -     CBC  Amenorrhea -     FSH/LH  Hypothyroidism  due to Hashimoto's thyroiditis -     TSH  Breast cancer screening by mammogram -     MM 3D SCREEN BREAST BILATERAL; Future  Screening for diabetes mellitus -     Hemoglobin A1c  Screening for blood disease -     Comprehensive metabolic panel -     CBC   Night sweats - given age and associated daytime flushing, fatigue and anxiety, suspect this is due to vasomotor instability from peri-menopause. Low suspicion for infectious etiology. We will check FSH/LH since we are unable to determine LMP. Will also check TSH given presence of Hashimotos. - counseled on non-hormonal treatments/general measures for managing symptoms of menopause - brief discussion on risks and benefits of HRT. She is not likely a candidate for this yet since LMP was <1 year ago. She understands she will need a baseline screening mammogram followed by yearly mammogram  Patient education and anticipatory guidance given Patient agrees with treatment plan Follow-up in 8 weeks or sooner as needed if symptoms worsen or fail to improve  Yvonne Russian PA-C

## 2018-07-01 ENCOUNTER — Encounter: Payer: Self-pay | Admitting: Physician Assistant

## 2018-07-01 DIAGNOSIS — N951 Menopausal and female climacteric states: Secondary | ICD-10-CM | POA: Insufficient documentation

## 2018-07-01 LAB — COMPREHENSIVE METABOLIC PANEL
AG Ratio: 1.8 (calc) (ref 1.0–2.5)
ALT: 13 U/L (ref 6–29)
AST: 15 U/L (ref 10–35)
Albumin: 4.7 g/dL (ref 3.6–5.1)
Alkaline phosphatase (APISO): 49 U/L (ref 33–130)
BUN: 15 mg/dL (ref 7–25)
CO2: 30 mmol/L (ref 20–32)
Calcium: 9.9 mg/dL (ref 8.6–10.4)
Chloride: 106 mmol/L (ref 98–110)
Creat: 0.98 mg/dL (ref 0.50–1.05)
Globulin: 2.6 g/dL (calc) (ref 1.9–3.7)
Glucose, Bld: 86 mg/dL (ref 65–99)
Potassium: 4 mmol/L (ref 3.5–5.3)
Sodium: 142 mmol/L (ref 135–146)
Total Bilirubin: 0.6 mg/dL (ref 0.2–1.2)
Total Protein: 7.3 g/dL (ref 6.1–8.1)

## 2018-07-01 LAB — CBC
HCT: 40.8 % (ref 35.0–45.0)
Hemoglobin: 14 g/dL (ref 11.7–15.5)
MCH: 31.1 pg (ref 27.0–33.0)
MCHC: 34.3 g/dL (ref 32.0–36.0)
MCV: 90.7 fL (ref 80.0–100.0)
MPV: 10.8 fL (ref 7.5–12.5)
Platelets: 196 10*3/uL (ref 140–400)
RBC: 4.5 10*6/uL (ref 3.80–5.10)
RDW: 12.1 % (ref 11.0–15.0)
WBC: 6.1 10*3/uL (ref 3.8–10.8)

## 2018-07-01 LAB — FSH/LH
FSH: 71.5 m[IU]/mL
LH: 46.1 m[IU]/mL

## 2018-07-01 LAB — HEMOGLOBIN A1C
Hgb A1c MFr Bld: 5.3 % of total Hgb (ref <5.7)
Mean Plasma Glucose: 105 (calc)
eAG (mmol/L): 5.8 (calc)

## 2018-07-01 LAB — TSH: TSH: 5.93 mIU/L — ABNORMAL HIGH

## 2018-07-01 MED ORDER — LEVOTHYROXINE SODIUM 50 MCG PO TABS: 50.0000 ug | ORAL_TABLET | Freq: Every day | ORAL | 3 refills | Status: DC

## 2018-07-01 NOTE — Progress Notes (Signed)
Hi [name],  Your labs confirm that you are in peri-menopause. Your TSH is also increased, which means you need additional thyroid support. I am increasing your Synthroid to 50 mcg. Be sure you are taking this every morning on an empty stomach by itself. Let's recheck your level in 3 months.

## 2018-07-01 NOTE — Addendum Note (Signed)
Addended by: Nelson Chimes E on: 07/01/2018 11:45 AM   Modules accepted: Orders

## 2019-04-22 ENCOUNTER — Other Ambulatory Visit: Payer: Self-pay | Admitting: Physician Assistant

## 2019-04-22 DIAGNOSIS — E038 Other specified hypothyroidism: Secondary | ICD-10-CM

## 2019-05-12 ENCOUNTER — Encounter: Payer: Self-pay | Admitting: Physician Assistant

## 2019-05-12 ENCOUNTER — Ambulatory Visit (INDEPENDENT_AMBULATORY_CARE_PROVIDER_SITE_OTHER): Payer: Managed Care, Other (non HMO) | Admitting: Physician Assistant

## 2019-05-12 ENCOUNTER — Other Ambulatory Visit: Payer: Self-pay

## 2019-05-12 VITALS — BP 113/75 | HR 61 | Temp 98.1°F | Wt 146.0 lb

## 2019-05-12 DIAGNOSIS — R5382 Chronic fatigue, unspecified: Secondary | ICD-10-CM | POA: Diagnosis not present

## 2019-05-12 DIAGNOSIS — E063 Autoimmune thyroiditis: Secondary | ICD-10-CM

## 2019-05-12 DIAGNOSIS — G8929 Other chronic pain: Secondary | ICD-10-CM

## 2019-05-12 DIAGNOSIS — F418 Other specified anxiety disorders: Secondary | ICD-10-CM

## 2019-05-12 DIAGNOSIS — E038 Other specified hypothyroidism: Secondary | ICD-10-CM

## 2019-05-12 DIAGNOSIS — G4701 Insomnia due to medical condition: Secondary | ICD-10-CM

## 2019-05-12 DIAGNOSIS — Z5181 Encounter for therapeutic drug level monitoring: Secondary | ICD-10-CM

## 2019-05-12 DIAGNOSIS — M545 Low back pain, unspecified: Secondary | ICD-10-CM

## 2019-05-12 MED ORDER — PAROXETINE HCL 10 MG PO TABS: 10.0000 mg | ORAL_TABLET | Freq: Every day | ORAL | 1 refills | Status: DC

## 2019-05-12 NOTE — Patient Instructions (Signed)
Stress Stress is a normal reaction to life events. Stress is what you feel when life demands more than you are used to, or more than you think you can handle. Some stress can be useful, such as studying for a test or meeting a deadline at work. Stress that occurs too often or for too long can cause problems. It can affect your emotional health and interfere with relationships and normal daily activities. Too much stress can weaken your body's defense system (immune system) and increase your risk for physical illness. If you already have a medical problem, stress can make it worse. What are the causes? All sorts of life events can cause stress. An event that causes stress for one person may not be stressful for another person. Major life events, whether positive or negative, commonly cause stress. Examples include:  Losing a job or starting a new job.  Losing a loved one.  Moving to a new town or home.  Getting married or divorced.  Having a baby.  Injury or illness. Less obvious life events can also cause stress, especially if they occur day after day or in combination with each other. Examples include:  Working long hours.  Driving in traffic.  Caring for children.  Being in debt.  Being in a difficult relationship. What are the signs or symptoms? Stress can cause emotional symptoms, including:  Anxiety. This is feeling worried, afraid, on edge, overwhelmed, or out of control.  Anger, including irritation or impatience.  Depression. This is feeling sad, down, helpless, or guilty.  Trouble focusing, remembering, or making decisions. Stress can cause physical symptoms, including:  Aches and pains. These may affect your head, neck, back, stomach, or other areas of your body.  Tight muscles or a clenched jaw.  Low energy.  Trouble sleeping. Stress can cause unhealthy behaviors, including:  Eating to feel better (overeating) or skipping meals.  Working too much or  putting off tasks.  Smoking, drinking alcohol, or using drugs to feel better. How is this diagnosed? Stress is diagnosed through an assessment by your health care provider. He or she may diagnose this condition based on:  Your symptoms and any stressful life events.  Your medical history.  Tests to rule out other causes of your symptoms. Depending on your condition, your health care provider may refer you to a specialist for further evaluation. How is this treated?  Stress management techniques are the recommended treatment for stress. Medicine is not typically recommended for the treatment of stress. Techniques to reduce your reaction to stressful life events include:  Stress identification. Monitor yourself for symptoms of stress and identify what causes stress for you. These skills may help you to avoid or prepare for stressful events.  Time management. Set your priorities, keep a calendar of events, and learn to say "no." Taking these actions can help you avoid making too many commitments. Techniques for coping with stress include:  Rethinking the problem. Try to think realistically about stressful events rather than ignoring them or overreacting. Try to find the positives in a stressful situation rather than focusing on the negatives.  Exercise. Physical exercise can release both physical and emotional tension. The key is to find a form of exercise that you enjoy and do it regularly.  Relaxation techniques. These relax the body and mind. The key is to find one or more that you enjoy and use the technique(s) regularly. Examples include: ? Meditation, deep breathing, or progressive relaxation techniques. ? Yoga or tai chi. ?  Biofeedback, mindfulness techniques, or journaling. ? Listening to music, being out in nature, or participating in other hobbies.  Practicing a healthy lifestyle. Eat a balanced diet, drink plenty of water, limit or avoid caffeine, and get plenty of sleep.   Having a strong support network. Spend time with family, friends, or other people you enjoy being around. Express your feelings and talk things over with someone you trust. Counseling or talk therapy with a mental health professional may be helpful if you are having trouble managing stress on your own. Follow these instructions at home: Lifestyle   Avoid drugs.  Do not use any products that contain nicotine or tobacco, such as cigarettes and e-cigarettes. If you need help quitting, ask your health care provider.  Limit alcohol intake to no more than 1 drink a day for nonpregnant women and 2 drinks a day for men. One drink equals 12 oz of beer, 5 oz of wine, or 1 oz of hard liquor.  Do not use alcohol or drugs to relax.  Eat a balanced diet that includes fresh fruits and vegetables, whole grains, lean meats, fish, eggs, and beans, and low-fat dairy. Avoid processed foods and foods high in added fat, sugar, and salt.  Exercise at least 30 minutes on 5 or more days each week.  Get 7-8 hours of sleep each night. General instructions   Practice stress management techniques as discussed with your health care provider.  Drink enough fluid to keep your urine clear or pale yellow.  Take over-the-counter and prescription medicines only as told by your health care provider.  Keep all follow-up visits as told by your health care provider. This is important. Contact a health care provider if:  Your symptoms get worse.  You have new symptoms.  You feel overwhelmed by your problems and can no longer manage them on your own. Get help right away if:  You have thoughts of hurting yourself or others. If you ever feel like you may hurt yourself or others, or have thoughts about taking your own life, get help right away. You can go to your nearest emergency department or call:  Your local emergency services (911 in the [place]  A suicide crisis helpline, such as the [place] at 705-500-9305. This is open 24 hours a day. Summary  Stress is a normal reaction to life events. It can cause problems if it happens too often or for too long.  Practicing stress management techniques is the best way to treat stress.  Counseling or talk therapy with a mental health professional may be helpful if you are having trouble managing stress on your own. This information is not intended to replace advice given to you by your health care provider. Make sure you discuss any questions you have with your health care provider. Document Released: 04/17/2001 Document Revised: 10/04/2017 Document Reviewed: 12/12/2016 Elsevier Patient Education  [place].   Fatigue If you have fatigue, you feel tired all the time and have a lack of energy or a lack of motivation. Fatigue may make it difficult to start or complete tasks because of exhaustion. In general, occasional or mild fatigue is often a normal response to activity or life. However, long-lasting (chronic) or extreme fatigue may be a symptom of a medical condition. Follow these instructions at home: General instructions  Watch your fatigue for any changes.  Go to bed and get up at the same time every day.  Avoid fatigue by pacing yourself during  the day and getting enough sleep at night.  Maintain a healthy weight. Medicines  Take over-the-counter and prescription medicines only as told by your health care provider.  Take a multivitamin, if told by your health care provider.  Do not use herbal or dietary supplements unless they are approved by your health care provider. Activity   Exercise regularly, as told by your health care provider.  Use or practice techniques to help you relax, such as yoga, tai chi, meditation, or massage therapy. Eating and drinking   Avoid heavy meals in the evening.  Eat a well-balanced diet, which includes lean proteins, whole grains, plenty of fruits and vegetables, and  low-fat dairy products.  Avoid consuming too much caffeine.  Avoid the use of alcohol.  Drink enough fluid to keep your urine pale yellow. Lifestyle  Change situations that cause you stress. Try to keep your work and personal schedule in balance.  Do not use any products that contain nicotine or tobacco, such as cigarettes and e-cigarettes. If you need help quitting, ask your health care provider.  Do not use drugs. Contact a health care provider if:  Your fatigue does not get better.  You have a fever.  You suddenly lose or gain weight.  You have headaches.  You have trouble falling asleep or sleeping through the night.  You feel angry, guilty, anxious, or sad.  You are unable to have a bowel movement (constipation).  Your skin is dry.  You have swelling in your legs or another part of your body. Get help right away if:  You feel confused.  Your vision is blurry.  You feel faint or you pass out.  You have a severe headache.  You have severe pain in your abdomen, your back, or the area between your waist and hips (pelvis).  You have chest pain, shortness of breath, or an irregular or fast heartbeat.  You are unable to urinate, or you urinate less than normal.  You have abnormal bleeding, such as bleeding from the rectum, vagina, nose, lungs, or nipples.  You vomit blood.  You have thoughts about hurting yourself or others. If you ever feel like you may hurt yourself or others, or have thoughts about taking your own life, get help right away. You can go to your nearest emergency department or call:  Your local emergency services (911 in the [place]  A suicide crisis helpline, such as the [place] at 206 616 3154. This is open 24 hours a day. Summary  If you have fatigue, you feel tired all the time and have a lack of energy or a lack of motivation.  Fatigue may make it difficult to start or complete tasks because of  exhaustion.  Long-lasting (chronic) or extreme fatigue may be a symptom of a medical condition.  Exercise regularly, as told by your health care provider.  Change situations that cause you stress. Try to keep your work and personal schedule in balance. This information is not intended to replace advice given to you by your health care provider. Make sure you discuss any questions you have with your health care provider. Document Released: 08/19/2007 Document Revised: 02/12/2019 Document Reviewed: 07/17/2017 Elsevier Patient Education  2020 [employer].

## 2019-05-12 NOTE — Progress Notes (Signed)
HPI:                                                                [name] is a 53 y.o. female who presents to [facility] today for anxiety/fatigue  05/12/19 Patient has not been seen in almost 1 year At last OV, Synthroid was increased to 50 mcg. Feels cold all of the time and fatigued. 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 Typically goes to bed at 9pm, takes an hour to fall asleep, wakes up around 3 am with back pain. Reports Melatonin and OTC sleep aids are not effective Endorses depressed mood Denies panic attacks  06/30/18 For the last 3 weeks patient has felt fatigued, increased anxiety, experiencing night sweats, and multiple nighttime awakenings. Reports she has never slept soundly through the night due to back pain and being a "poor sleeper." The sweating has exacerbated this. Denies drenching nightsweats, cough, dyspnea, fevers, chills, weight loss. Has also had some daytime flushing lasting several minutes 2-3 times per day. LMP was end of 2018, she had a D&C in January. Recently started taking Black Cohosh. Takes low-dose Synthroid 25 mcg for Hashimoto's disease and reports she is compliant with this Drinks 2 beers per day and has done so for years. Depression screen [facility] 2/9 05/12/2019 05/22/2017  Decreased Interest 0 0  Down, Depressed, Hopeless 3 0  PHQ - 2 Score 3 0  Altered sleeping 3 -  Tired, decreased energy 3 -  Change in appetite 0 -  Feeling bad or failure about yourself  1 -  Trouble concentrating 0 -  Moving slowly or fidgety/restless 0 -  Suicidal thoughts 0 -  PHQ-9 Score 10 -    GAD 7 : Generalized Anxiety Score 05/12/2019  Nervous, Anxious, on  Edge 3  Control/stop worrying 3  Worry too much - different things 3  Trouble relaxing 3  Restless 0  Easily annoyed or irritable 0  Afraid - awful might happen 1  Total GAD 7 Score 13      Past Medical History:  Diagnosis Date  . Abnormal pap   . Abnormal uterine bleeding (AUB) 09/09/2017  . History of hiatal hernia   . Hyperlipidemia   . Hypothyroidism due to Hashimoto's thyroiditis 05/22/2017  . IBS (irritable bowel syndrome)   . LGSIL on Pap smear of cervix 2012  . Metatarsalgia of left foot   . Neuritis   . PONV (postoperative nausea and vomiting)    Past Surgical History:  Procedure Laterality Date  . DILITATION & CURRETTAGE/HYSTROSCOPY WITH HYDROTHERMAL ABLATION N/A 11/13/2017   Procedure: DILATATION & CURETTAGE/HYSTEROSCOPY WITH HYDROTHERMAL ABLATION;  Surgeon: Emily Filbert, MD;  Location: [facility];  Service: Gynecology;  Laterality: N/A;  . ENDOMETRIAL BIOPSY    . microdiskectomy  2007   L spine, done in [place]  . PLACEMENT OF BREAST IMPLANTS    . TONSILLECTOMY    . UPPER GI ENDOSCOPY  2008  . WISDOM  TOOTH EXTRACTION     Social History   Tobacco Use  . Smoking status: Never Smoker  . Smokeless tobacco: Never Used  Substance Use Topics  . Alcohol use: Yes    Alcohol/week: 3.0 standard drinks    Types: 3 Cans of beer per week   family history includes Celiac disease in her brother; Diabetes in her mother; Thyroid disease in her sister.    Review of Systems  Constitutional: Positive for diaphoresis (occasional) and fatigue.  Cardiovascular: Negative for leg swelling.  Endocrine: Positive for cold intolerance.  Musculoskeletal: Positive for back pain. Negative for joint swelling and myalgias.  Skin: Negative for rash.  Psychiatric/Behavioral: Positive for dysphoric mood and sleep disturbance. Negative for agitation, self-injury and suicidal ideas. The patient is nervous/anxious.   All other systems reviewed and are  negative.    Medications: Current Outpatient Medications  Medication Sig Dispense Refill  . levothyroxine (SYNTHROID) 50 MCG tablet TAKE 1 TABLET(50 MCG) BY MOUTH DAILY BEFORE BREAKFAST 90 tablet 3  . BLACK COHOSH COMPOUND PO Take by mouth.     No current facility-administered medications for this visit.    Allergies  Allergen Reactions  . Codeine   . Erythromycin        Objective:  BP 113/75   Pulse 61   Temp 98.1 F (36.7 C) (Oral)   Wt 146 lb (66.2 kg)   BMI 24.30 kg/m  Gen:  alert, not ill-appearing, no distress, appropriate for age 59: head normocephalic without obvious abnormality, conjunctiva and cornea clear, trachea midline Pulm: Normal work of breathing, normal phonation, clear to auscultation bilaterally, no wheezes, rales or rhonchi CV: Normal rate, regular rhythm, s1 and s2 distinct, no murmurs, clicks or rubs  Neuro: alert and oriented x 3, no tremor MSK: extremities atraumatic, normal gait and station Skin: intact, no rashes on exposed skin, no jaundice, no cyanosis Psych: well-groomed, cooperative, good eye contact, euthymic mood, affect mood-congruent, speech is articulate, and thought processes clear and goal-directed  Lab Results  Component Value Date   CREATININE 0.98 06/30/2018   BUN 15 06/30/2018   NA 142 06/30/2018   K 4.0 06/30/2018   CL 106 06/30/2018   CO2 30 06/30/2018   Lab Results  Component Value Date   ALT 13 06/30/2018   AST 15 06/30/2018   ALKPHOS 42 05/14/2017   BILITOT 0.6 06/30/2018   Lab Results  Component Value Date   TSH 5.93 (H) 06/30/2018   Lab Results  Component Value Date   WBC 6.1 06/30/2018   HGB 14.0 06/30/2018   HCT 40.8 06/30/2018   MCV 90.7 06/30/2018   PLT 196 06/30/2018   No results found for: IRON, TIBC, FERRITIN   No results found for this or any previous visit (from the past 72 hour(s)). No results found.    Assessment and Plan: 53 y.o. female with   .Mira was seen today for anemia  and fatigue.  Diagnoses and all orders for this visit:  Hypothyroidism due to Hashimoto's thyroiditis -     TSH + free T4  Chronic fatigue -     COMPLETE METABOLIC PANEL WITH GFR -     TSH + free T4 -     CBC -     Vitamin D 1,25 dihydroxy -     Sedimentation rate -     C-reactive protein -     Ferritin  Anxiety with depression -     PARoxetine (PAXIL) 10 MG tablet; Take 1 tablet (10  mg total) by mouth at bedtime.  Insomnia secondary to chronic pain  Chronic midline low back pain without sciatica  Medication monitoring encounter -     COMPLETE METABOLIC PANEL WITH GFR -     TSH + free T4 -     CBC   Anxiety with depression PHQ9=10, no acute safety issues GAD7=13 Patient is medication naive. Declines referral for counseling Starting Paxil 10 mg QHS for comorbid sleep difficulties Consider Cymbalta if Paxil fails  Chronic fatigue Likely multifactorial - Hashimotos, perimenopause/nightsweats, insomnia due to pain and anxiety Counseled on sleep hygiene Lab work-up as above  Patient education and anticipatory guidance given Patient agrees with treatment plan Follow-up in 1 month for fatigue/anxiety or sooner as needed if symptoms worsen or fail to improve  Darlyne Russian PA-C

## 2019-05-14 LAB — TSH+FREE T4: TSH W/REFLEX TO FT4: 2.7 mIU/L

## 2019-05-14 LAB — COMPLETE METABOLIC PANEL WITH GFR
AG Ratio: 1.8 (calc) (ref 1.0–2.5)
ALT: 12 U/L (ref 6–29)
AST: 14 U/L (ref 10–35)
Albumin: 4.7 g/dL (ref 3.6–5.1)
Alkaline phosphatase (APISO): 49 U/L (ref 37–153)
BUN: 17 mg/dL (ref 7–25)
CO2: 30 mmol/L (ref 20–32)
Calcium: 9.7 mg/dL (ref 8.6–10.4)
Chloride: 102 mmol/L (ref 98–110)
Creat: 0.91 mg/dL (ref 0.50–1.05)
GFR, Est African American: 82 mL/min/{1.73_m2} (ref >=60)
GFR, Est Non African American: 71 mL/min/{1.73_m2} (ref >=60)
Globulin: 2.6 g/dL (calc) (ref 1.9–3.7)
Glucose, Bld: 88 mg/dL (ref 65–139)
Potassium: 3.8 mmol/L (ref 3.5–5.3)
Sodium: 141 mmol/L (ref 135–146)
Total Bilirubin: 0.5 mg/dL (ref 0.2–1.2)
Total Protein: 7.3 g/dL (ref 6.1–8.1)

## 2019-05-14 LAB — CBC
HCT: 42.1 % (ref 35.0–45.0)
Hemoglobin: 14.3 g/dL (ref 11.7–15.5)
MCH: 31.2 pg (ref 27.0–33.0)
MCHC: 34 g/dL (ref 32.0–36.0)
MCV: 91.9 fL (ref 80.0–100.0)
MPV: 10.7 fL (ref 7.5–12.5)
Platelets: 210 10*3/uL (ref 140–400)
RBC: 4.58 10*6/uL (ref 3.80–5.10)
RDW: 12.2 % (ref 11.0–15.0)
WBC: 7.5 10*3/uL (ref 3.8–10.8)

## 2019-05-14 LAB — VITAMIN D 1,25 DIHYDROXY
Vitamin D 1, 25 (OH)2 Total: 34 pg/mL (ref 18–72)
Vitamin D2 1, 25 (OH)2: <8 pg/mL
Vitamin D3 1, 25 (OH)2: 34 pg/mL

## 2019-05-14 LAB — SEDIMENTATION RATE: Sed Rate: 2 mm/h (ref 0–30)

## 2019-05-14 LAB — FERRITIN: Ferritin: 50 ng/mL (ref 16–232)

## 2019-05-14 LAB — C-REACTIVE PROTEIN: CRP: 1.4 mg/L (ref <8.0)

## 2019-06-19 ENCOUNTER — Ambulatory Visit: Payer: Managed Care, Other (non HMO) | Admitting: Physician Assistant

## 2019-06-22 ENCOUNTER — Ambulatory Visit (INDEPENDENT_AMBULATORY_CARE_PROVIDER_SITE_OTHER): Payer: Managed Care, Other (non HMO) | Admitting: Physician Assistant

## 2019-06-22 ENCOUNTER — Encounter: Payer: Self-pay | Admitting: Physician Assistant

## 2019-06-22 ENCOUNTER — Other Ambulatory Visit: Payer: Self-pay

## 2019-06-22 ENCOUNTER — Telehealth: Payer: Self-pay

## 2019-06-22 ENCOUNTER — Other Ambulatory Visit: Payer: Self-pay | Admitting: Physician Assistant

## 2019-06-22 DIAGNOSIS — E063 Autoimmune thyroiditis: Secondary | ICD-10-CM

## 2019-06-22 DIAGNOSIS — E038 Other specified hypothyroidism: Secondary | ICD-10-CM

## 2019-06-22 DIAGNOSIS — F418 Other specified anxiety disorders: Secondary | ICD-10-CM | POA: Diagnosis not present

## 2019-06-22 MED ORDER — LEVOTHYROXINE SODIUM 75 MCG PO TABS: 75.0000 ug | ORAL_TABLET | Freq: Every day | ORAL | 0 refills | Status: DC

## 2019-06-22 MED ORDER — PAROXETINE HCL 10 MG PO TABS: 10.0000 mg | ORAL_TABLET | Freq: Every day | ORAL | 1 refills | Status: DC

## 2019-06-22 NOTE — Progress Notes (Signed)
HPI:                                                                [name] is a 53 y.o. female who presents to [facility]: [place] today for anxiety and fatigue follow-up  Anxiety: She started Paxil 10 mg approx 6 weeks ago. Reports she is more functional and less anxious. Denies adverse effects. Sleep difficulties and fatigue are unchanged. No concerns today.  Hypothyroidism: synthroid increased to 75 mcg 6 weeks ago. Denies chest pain, heart palpitations, tremor, GI symptoms, or skin changes.  Depression screen [facility] 2/9 06/22/2019 05/12/2019 05/22/2017  Decreased Interest 0 0 0  Down, Depressed, Hopeless 1 3 0  PHQ - 2 Score 1 3 0  Altered sleeping 3 3 -  Tired, decreased energy 3 3 -  Change in appetite 1 0 -  Feeling bad or failure about yourself  0 1 -  Trouble concentrating 0 0 -  Moving slowly or fidgety/restless 0 0 -  Suicidal thoughts 0 0 -  PHQ-9 Score 8 10 -  Difficult doing work/chores Not difficult at all - -    GAD 7 : Generalized Anxiety Score 06/22/2019 05/12/2019  Nervous, Anxious, on Edge 1 3  Control/stop worrying 0 3  Worry too much - different things 1 3  Trouble relaxing 0 3  Restless 0 0  Easily annoyed or irritable 0 0  Afraid - awful might happen 0 1  Total GAD 7 Score 2 13  Anxiety Difficulty Not difficult at all -      Past Medical History:  Diagnosis Date  . Abnormal pap   . Abnormal uterine bleeding (AUB) 09/09/2017  . History of hiatal hernia   . Hyperlipidemia   . Hypothyroidism due to Hashimoto's thyroiditis 05/22/2017  . IBS (irritable bowel syndrome)   . LGSIL on Pap smear of cervix 2012  . Metatarsalgia of left foot   . Neuritis   . PONV (postoperative nausea and vomiting)    Past Surgical History:  Procedure Laterality Date  . DILITATION & CURRETTAGE/HYSTROSCOPY WITH HYDROTHERMAL ABLATION N/A 11/13/2017   Procedure: DILATATION & CURETTAGE/HYSTEROSCOPY WITH HYDROTHERMAL ABLATION;  Surgeon:  Emily Filbert, MD;  Location: [facility];  Service: Gynecology;  Laterality: N/A;  . ENDOMETRIAL BIOPSY    . microdiskectomy  2007   L spine, done in [place]  . PLACEMENT OF BREAST IMPLANTS    . TONSILLECTOMY    . UPPER GI ENDOSCOPY  2008  . WISDOM TOOTH EXTRACTION     Social History   Tobacco Use  . Smoking status: Never Smoker  . Smokeless tobacco: Never Used  Substance Use Topics  . Alcohol use: Yes    Alcohol/week: 3.0 standard drinks    Types: 3 Cans of beer per week   family history includes Celiac disease in her brother; Diabetes in her mother; Thyroid disease in her sister.    ROS: negative except as noted in the HPI  Medications: Current Outpatient Medications  Medication Sig Dispense Refill  . BLACK COHOSH COMPOUND PO Take by mouth.    . levothyroxine (SYNTHROID) 75 MCG tablet TAKE 1 TABLET BY MOUTH DAILY BEFORE BREAKFAST 90 tablet 0  . PARoxetine (PAXIL) 10 MG tablet TAKE 1  TABLET BY MOUTH DAILY AT BEDTIME 30 tablet 1   No current facility-administered medications for this visit.    Allergies  Allergen Reactions  . Codeine   . Erythromycin        Objective:  BP 113/74   Pulse 66   Temp 98.1 F (36.7 C) (Oral)   Wt 151 lb (68.5 kg)   BMI 25.13 kg/m  Gen:  alert, not ill-appearing, no distress, appropriate for age HEENT: head normocephalic without obvious abnormality, conjunctiva and cornea clear, trachea midline Pulm: Normal work of breathing, normal phonation, clear to auscultation bilaterally, no wheezes, rales or rhonchi CV: Normal rate, regular rhythm, s1 and s2 distinct, no murmurs, clicks or rubs  Neuro: alert and oriented x 3, no tremor MSK: extremities atraumatic, normal gait and station Skin: intact, no rashes on exposed skin, no jaundice, no cyanosis Psych: well-groomed, cooperative, good eye contact, euthymic mood, affect mood-congruent, speech is articulate, and thought processes clear and goal-directed  Recent Results (from  the past 2160 hour(s))  COMPLETE METABOLIC PANEL WITH GFR     Status: None   Collection Time: 05/12/19  4:46 PM  Result Value Ref Range   Glucose, Bld 88 65 - 139 mg/dL    Comment: .        Non-fasting reference interval .    BUN 17 7 - 25 mg/dL   Creat 0.91 0.50 - 1.05 mg/dL    Comment: For patients >73 years of age, the reference limit for Creatinine is approximately 13% higher for people identified as African-American. .    GFR, Est Non African American 71 > OR = 60 mL/min/1.7m2   GFR, Est African American 82 > OR = 60 mL/min/1.23m2   BUN/Creatinine Ratio NOT APPLICABLE 6 - 22 (calc)   Sodium 141 135 - 146 mmol/L   Potassium 3.8 3.5 - 5.3 mmol/L   Chloride 102 98 - 110 mmol/L   CO2 30 20 - 32 mmol/L   Calcium 9.7 8.6 - 10.4 mg/dL   Total Protein 7.3 6.1 - 8.1 g/dL   Albumin 4.7 3.6 - 5.1 g/dL   Globulin 2.6 1.9 - 3.7 g/dL (calc)   AG Ratio 1.8 1.0 - 2.5 (calc)   Total Bilirubin 0.5 0.2 - 1.2 mg/dL   Alkaline phosphatase (APISO) 49 37 - 153 U/L   AST 14 10 - 35 U/L   ALT 12 6 - 29 U/L  TSH + free T4     Status: None   Collection Time: 05/12/19  4:46 PM  Result Value Ref Range   TSH W/REFLEX TO FT4 2.70 mIU/L    Comment:           Reference Range .           > or = 20 Years  0.40-4.50 .                Pregnancy Ranges           First trimester    0.26-2.66           Second trimester   0.55-2.73           Third trimester    0.43-2.91   CBC     Status: None   Collection Time: 05/12/19  4:46 PM  Result Value Ref Range   WBC 7.5 3.8 - 10.8 Thousand/uL   RBC 4.58 3.80 - 5.10 Million/uL   Hemoglobin 14.3 11.7 - 15.5 g/dL   HCT 42.1 35.0 - 45.0 %  MCV 91.9 80.0 - 100.0 fL   MCH 31.2 27.0 - 33.0 pg   MCHC 34.0 32.0 - 36.0 g/dL   RDW 12.2 11.0 - 15.0 %   Platelets 210 140 - 400 Thousand/uL   MPV 10.7 7.5 - 12.5 fL  Vitamin D 1,25 dihydroxy     Status: None   Collection Time: 05/12/19  4:46 PM  Result Value Ref Range   Vitamin D 1, 25 (OH)2 Total 34 18 - 72 pg/mL    Vitamin D3 1, 25 (OH)2 34 pg/mL   Vitamin D2 1, 25 (OH)2 <8 pg/mL    Comment: Marland Kitchen Vitamin D3, 1,25(OH)2 indicates both endogenous production and supplementation. Vitamin D2, 1,25(OH)2 is an indicator of exogenous sources, such as diet or supplementation.  Interpretation and therapy are based on measurement of Vitamin D,1,25(OH)2, Total. . . This test was developed and its analytical performance characteristics have been determined by [facility], [place], [place]. It has not been cleared or approved by the FDA. This assay has been validated pursuant to the CLIA regulations and is used for clinical purposes. .   Sedimentation rate     Status: None   Collection Time: 05/12/19  4:46 PM  Result Value Ref Range   Sed Rate 2 0 - 30 mm/h  C-reactive protein     Status: None   Collection Time: 05/12/19  4:46 PM  Result Value Ref Range   CRP 1.4 <8.0 mg/L  Ferritin     Status: None   Collection Time: 05/12/19  4:46 PM  Result Value Ref Range   Ferritin 50 16 - 232 ng/mL     No results found for this or any previous visit (from the past 72 hour(s)). No results found.    Assessment and Plan: 53 y.o. female with   .Jesalyn was seen today for medication management.  Diagnoses and all orders for this visit:  Hypothyroidism due to Hashimoto's thyroiditis -     Discontinue: levothyroxine (SYNTHROID) 75 MCG tablet; Take 1 tablet (75 mcg total) by mouth daily before breakfast. -     TSH  Anxiety with depression -     Discontinue: PARoxetine (PAXIL) 10 MG tablet; Take 1 tablet (10 mg total) by mouth at bedtime.  Doing well on Paxil 10 mg  Declines to increase medication at this time  Increase synthroid to 75 mcg QAC Check TSH in 6 weeks   Patient education and anticipatory guidance given Patient agrees with treatment plan Follow-up in 6 months or sooner as needed if symptoms worsen or fail to improve  Darlyne Russian PA-C

## 2019-06-22 NOTE — Telephone Encounter (Signed)
Opened in error. -EH/RMA

## 2019-07-06 ENCOUNTER — Other Ambulatory Visit: Payer: Self-pay | Admitting: Physician Assistant

## 2019-07-06 DIAGNOSIS — F418 Other specified anxiety disorders: Secondary | ICD-10-CM

## 2019-07-26 ENCOUNTER — Encounter: Payer: Self-pay | Admitting: Physician Assistant

## 2019-08-25 ENCOUNTER — Other Ambulatory Visit: Payer: Self-pay | Admitting: Obstetrics & Gynecology

## 2019-08-25 DIAGNOSIS — Z1231 Encounter for screening mammogram for malignant neoplasm of breast: Secondary | ICD-10-CM

## 2019-09-03 LAB — TSH: TSH: 1.41 mIU/L

## 2019-09-07 ENCOUNTER — Other Ambulatory Visit: Payer: Self-pay | Admitting: Osteopathic Medicine

## 2019-09-07 DIAGNOSIS — E038 Other specified hypothyroidism: Secondary | ICD-10-CM

## 2019-09-07 DIAGNOSIS — E063 Autoimmune thyroiditis: Secondary | ICD-10-CM

## 2019-09-07 MED ORDER — LEVOTHYROXINE SODIUM 75 MCG PO TABS: 75.0000 ug | ORAL_TABLET | Freq: Every day | ORAL | 0 refills | Status: DC

## 2019-09-24 ENCOUNTER — Ambulatory Visit (INDEPENDENT_AMBULATORY_CARE_PROVIDER_SITE_OTHER): Payer: Managed Care, Other (non HMO)

## 2019-09-24 ENCOUNTER — Other Ambulatory Visit: Payer: Self-pay

## 2019-09-24 DIAGNOSIS — Z1231 Encounter for screening mammogram for malignant neoplasm of breast: Secondary | ICD-10-CM

## 2019-09-28 ENCOUNTER — Other Ambulatory Visit: Payer: Self-pay | Admitting: Obstetrics & Gynecology

## 2019-09-28 DIAGNOSIS — R928 Other abnormal and inconclusive findings on diagnostic imaging of breast: Secondary | ICD-10-CM

## 2019-10-13 ENCOUNTER — Other Ambulatory Visit: Payer: Self-pay | Admitting: Obstetrics & Gynecology

## 2019-10-13 ENCOUNTER — Other Ambulatory Visit: Payer: Self-pay

## 2019-10-13 ENCOUNTER — Ambulatory Visit
Admission: RE | Admit: 2019-10-13 | Discharge: 2019-10-13 | Disposition: A | Discharge status: Home / Self Care | Payer: Managed Care, Other (non HMO) | Source: Ambulatory Visit | Attending: Obstetrics & Gynecology | Admitting: Obstetrics & Gynecology

## 2019-10-13 DIAGNOSIS — R921 Mammographic calcification found on diagnostic imaging of breast: Secondary | ICD-10-CM

## 2019-10-13 DIAGNOSIS — R928 Other abnormal and inconclusive findings on diagnostic imaging of breast: Secondary | ICD-10-CM

## 2019-10-23 ENCOUNTER — Ambulatory Visit
Admission: RE | Admit: 2019-10-23 | Discharge: 2019-10-23 | Disposition: A | Discharge status: Home / Self Care | Payer: Managed Care, Other (non HMO) | Source: Ambulatory Visit | Attending: Obstetrics & Gynecology | Admitting: Obstetrics & Gynecology

## 2019-10-23 ENCOUNTER — Other Ambulatory Visit: Payer: Self-pay

## 2019-10-23 DIAGNOSIS — R921 Mammographic calcification found on diagnostic imaging of breast: Secondary | ICD-10-CM

## 2019-11-11 ENCOUNTER — Ambulatory Visit: Payer: Self-pay | Admitting: General Surgery

## 2019-11-11 ENCOUNTER — Encounter: Payer: Self-pay | Admitting: *Deleted

## 2019-11-11 DIAGNOSIS — N6092 Unspecified benign mammary dysplasia of left breast: Secondary | ICD-10-CM

## 2019-11-18 ENCOUNTER — Other Ambulatory Visit: Payer: Self-pay | Admitting: General Surgery

## 2019-11-18 DIAGNOSIS — N6092 Unspecified benign mammary dysplasia of left breast: Secondary | ICD-10-CM

## 2019-12-02 ENCOUNTER — Ambulatory Visit: Payer: Managed Care, Other (non HMO) | Admitting: Osteopathic Medicine

## 2019-12-07 ENCOUNTER — Ambulatory Visit (INDEPENDENT_AMBULATORY_CARE_PROVIDER_SITE_OTHER): Payer: Managed Care, Other (non HMO) | Admitting: Osteopathic Medicine

## 2019-12-07 ENCOUNTER — Encounter: Payer: Self-pay | Admitting: Osteopathic Medicine

## 2019-12-07 ENCOUNTER — Other Ambulatory Visit: Payer: Self-pay

## 2019-12-07 VITALS — BP 118/77 | HR 69 | Temp 98.2°F | Wt 159.1 lb

## 2019-12-07 DIAGNOSIS — E038 Other specified hypothyroidism: Secondary | ICD-10-CM

## 2019-12-07 DIAGNOSIS — F418 Other specified anxiety disorders: Secondary | ICD-10-CM

## 2019-12-07 DIAGNOSIS — E063 Autoimmune thyroiditis: Secondary | ICD-10-CM | POA: Diagnosis not present

## 2019-12-07 DIAGNOSIS — Z23 Encounter for immunization: Secondary | ICD-10-CM | POA: Diagnosis not present

## 2019-12-07 MED ORDER — ESCITALOPRAM OXALATE 10 MG PO TABS: ORAL_TABLET | ORAL | 0 refills | Status: DC

## 2019-12-07 NOTE — Progress Notes (Signed)
[name] is a 54 y.o. female who presents to  [facility]  today, 12/07/19, seeking care for the following:  The primary encounter diagnosis was Anxiety with depression. Diagnoses of Need for shingles vaccine, Need for Tdap vaccination, and Hypothyroidism due to Hashimoto's thyroiditis were also pertinent to this visit.     ASSESSMENT & PLAN with other pertinent history/findings:  1. Anxiety with depression Worsening fatigue and weight gain, anxiety/depression Pt attributes some to menopause, possible to Paxil No hot flashes Awaiting lumpectomy results - will hold off on estrogen Will trial lexapro in lieu of paxil and see if this helps (hopefully will help sleep, weight)   2. Need for shingles vaccine 3. Need for Tdap vaccinatination done  4. Hypothyroidism due to Hashimoto's thyroiditis Meds/labs as below      Orders Placed This Encounter  Procedures  . Varicella-zoster vaccine IM (Shingrix)  . Tdap vaccine greater than or equal to 7yo IM    Meds ordered this encounter  Medications  . escitalopram (LEXAPRO) 10 MG tablet    Sig: Take 0.5 tablets (5 mg total) by mouth daily for 6 days, THEN 1 tablet (10 mg total) daily.    Dispense:  90 tablet    Refill:  0    Patient Instructions  Plan:   With the Paxil, would take 1/2 tablet for 1 week, at the same time start the Lexapro at half tablet for 1 week then increase to a whole tablet of Lexapro and stop the Paxil altogether.  Let's try this for about 6 to 8 weeks or so with Lexapro at 10 mg daily.  People tend to do better if they take this in the evenings.  Let's wait on the biopsy results for the breast surgery, if there is any evidence of breast cancer we definitely would not want to have you on estrogen, but if there is no cancer then estrogen would be an option.        Follow-up instructions: Return in about 6 weeks (around 01/18/2020) for virtual  visit recheck mental health w/ Dr Sheppard Coil; nurse visit Shingrix #2 after 02/04/20. .     .                      BP 118/77 (BP Location: Left Arm, Patient Position: Sitting, Cuff Size: Normal)   Pulse 69   Temp 98.2 F (36.8 C) (Oral)   Wt 159 lb 1.9 oz (72.2 kg)   BMI 26.48 kg/m   Current Meds  Medication Sig  . levothyroxine (SYNTHROID) 75 MCG tablet Take 1 tablet (75 mcg total) by mouth daily before breakfast.  . [DISCONTINUED] PARoxetine (PAXIL) 10 MG tablet TAKE 1 TABLET BY MOUTH DAILY AT BEDTIME    No results found for this or any previous visit (from the past 72 hour(s)).  No results found.  Depression screen [facility] 2/9 12/07/2019 06/22/2019 05/12/2019  Decreased Interest 1 0 0  Down, Depressed, Hopeless 1 1 3   PHQ - 2 Score 2 1 3   Altered sleeping 1 3 3   Tired, decreased energy 1 3 3   Change in appetite 0 1 0  Feeling bad or failure about yourself  1 0 1  Trouble concentrating 0 0 0  Moving slowly or fidgety/restless 0 0 0  Suicidal thoughts 0 0 0  PHQ-9 Score 5 8 10   Difficult doing work/chores Not difficult at all Not difficult at all -  GAD 7 : Generalized Anxiety Score 12/07/2019 06/22/2019 05/12/2019  Nervous, Anxious, on Edge 3 1 3   Control/stop worrying 3 0 3  Worry too much - different things 2 1 3   Trouble relaxing 3 0 3  Restless 0 0 0  Easily annoyed or irritable 0 0 0  Afraid - awful might happen 2 0 1  Total GAD 7 Score 13 2 13   Anxiety Difficulty Not difficult at all Not difficult at all -      All questions at time of visit were answered - patient instructed to contact office with any additional concerns or updates.  ER/RTC precautions were reviewed with the patient.  Please note: voice recognition software was used to produce this document, and typos may escape review. Please contact Dr. Sheppard Coil for any needed clarifications.

## 2019-12-07 NOTE — Patient Instructions (Signed)
Plan:   With the Paxil, would take 1/2 tablet for 1 week, at the same time start the Lexapro at half tablet for 1 week then increase to a whole tablet of Lexapro and stop the Paxil altogether.  Let's try this for about 6 to 8 weeks or so with Lexapro at 10 mg daily.  People tend to do better if they take this in the evenings.  Let's wait on the biopsy results for the breast surgery, if there is any evidence of breast cancer we definitely would not want to have you on estrogen, but if there is no cancer then estrogen would be an option.

## 2020-01-01 ENCOUNTER — Other Ambulatory Visit: Payer: Self-pay | Admitting: Osteopathic Medicine

## 2020-01-01 ENCOUNTER — Other Ambulatory Visit: Payer: Self-pay

## 2020-01-01 DIAGNOSIS — E038 Other specified hypothyroidism: Secondary | ICD-10-CM

## 2020-01-01 MED ORDER — LEVOTHYROXINE SODIUM 75 MCG PO TABS: 75.0000 ug | ORAL_TABLET | Freq: Every day | ORAL | 0 refills | Status: DC

## 2020-01-01 NOTE — Telephone Encounter (Signed)
[facility] pharmacy sent a refill request for levothyroxine. Last thyroid check was 09/03/19, results were normal. Pt was last seen on 12/07/19 and has an upcoming appt on 01/20/20. #30 days supply sent to the pharmacy. Pls add thyroid lab order for pt. Thanks.

## 2020-01-08 ENCOUNTER — Other Ambulatory Visit: Payer: Self-pay

## 2020-01-08 ENCOUNTER — Encounter (HOSPITAL_BASED_OUTPATIENT_CLINIC_OR_DEPARTMENT_OTHER): Payer: Self-pay | Admitting: General Surgery

## 2020-01-12 ENCOUNTER — Other Ambulatory Visit (HOSPITAL_COMMUNITY)
Admission: RE | Admit: 2020-01-12 | Discharge: 2020-01-12 | Disposition: A | Discharge status: Home / Self Care | Payer: Managed Care, Other (non HMO) | Source: Ambulatory Visit | Attending: General Surgery | Admitting: General Surgery

## 2020-01-12 DIAGNOSIS — Z01812 Encounter for preprocedural laboratory examination: Secondary | ICD-10-CM | POA: Diagnosis present

## 2020-01-12 DIAGNOSIS — Z20822 Contact with and (suspected) exposure to covid-19: Secondary | ICD-10-CM | POA: Diagnosis not present

## 2020-01-12 LAB — SARS CORONAVIRUS 2 (TAT 6-24 HRS): SARS Coronavirus 2: NEGATIVE

## 2020-01-14 ENCOUNTER — Other Ambulatory Visit: Payer: Self-pay

## 2020-01-14 ENCOUNTER — Ambulatory Visit
Admission: RE | Admit: 2020-01-14 | Discharge: 2020-01-14 | Disposition: A | Discharge status: Home / Self Care | Payer: Managed Care, Other (non HMO) | Source: Ambulatory Visit | Attending: General Surgery | Admitting: General Surgery

## 2020-01-14 DIAGNOSIS — N6092 Unspecified benign mammary dysplasia of left breast: Secondary | ICD-10-CM

## 2020-01-14 NOTE — Anesthesia Preprocedure Evaluation (Signed)
Anesthesia Evaluation  Patient identified by MRN, date of birth, ID band Patient awake    Reviewed: Allergy & Precautions, NPO status , Patient's Chart, lab work & pertinent test results  History of Anesthesia Complications (+) PONV and history of anesthetic complications  Airway Mallampati: II  TM Distance: >3 FB Neck ROM: Full    Dental  (+) Teeth Intact, Dental Advisory Given   Pulmonary neg pulmonary ROS,    Pulmonary exam normal breath sounds clear to auscultation       Cardiovascular Exercise Tolerance: Good negative cardio ROS Normal cardiovascular exam Rhythm:Regular Rate:Normal     Neuro/Psych negative neurological ROS  negative psych ROS   GI/Hepatic negative GI ROS, Neg liver ROS,   Endo/Other  Hypothyroidism   Renal/GU negative Renal ROS     Musculoskeletal negative musculoskeletal ROS (+)   Abdominal   Peds  Hematology negative hematology ROS (+)   Anesthesia Other Findings Day of surgery medications reviewed with the patient.  Reproductive/Obstetrics AUB                             Anesthesia Physical  Anesthesia Plan  ASA: II  Anesthesia Plan: General   Post-op Pain Management:    Induction: Intravenous  PONV Risk Score and Plan: 4 or greater and Dexamethasone, Ondansetron, Scopolamine patch - Pre-op, TIVA and Midazolam  Airway Management Planned: LMA and Oral ETT  Additional Equipment:   Intra-op Plan:   Post-operative Plan: Extubation in OR  Informed Consent: I have reviewed the patients History and Physical, chart, labs and discussed the procedure including the risks, benefits and alternatives for the proposed anesthesia with the patient or authorized representative who has indicated his/her understanding and acceptance.     Dental advisory given  Plan Discussed with: CRNA and Anesthesiologist  Anesthesia Plan Comments: (Risks/benefits of general  anesthesia discussed with patient including risk of damage to teeth, lips, gum, and tongue, nausea/vomiting, allergic reactions to medications, and the possibility of heart attack, stroke and death.  All patient questions answered.  Patient wishes to proceed.)        Anesthesia Quick Evaluation

## 2020-01-14 NOTE — Progress Notes (Signed)

## 2020-01-15 ENCOUNTER — Encounter (HOSPITAL_BASED_OUTPATIENT_CLINIC_OR_DEPARTMENT_OTHER)
Admission: RE | Disposition: A | Discharge status: Home / Self Care | Payer: Self-pay | Source: Home / Self Care | Attending: General Surgery

## 2020-01-15 ENCOUNTER — Ambulatory Visit
Admission: RE | Admit: 2020-01-15 | Discharge: 2020-01-15 | Disposition: A | Discharge status: Home / Self Care | Payer: Managed Care, Other (non HMO) | Source: Ambulatory Visit | Attending: General Surgery | Admitting: General Surgery

## 2020-01-15 ENCOUNTER — Other Ambulatory Visit: Payer: Self-pay

## 2020-01-15 ENCOUNTER — Ambulatory Visit (HOSPITAL_BASED_OUTPATIENT_CLINIC_OR_DEPARTMENT_OTHER): Payer: Managed Care, Other (non HMO) | Admitting: Anesthesiology

## 2020-01-15 ENCOUNTER — Encounter (HOSPITAL_BASED_OUTPATIENT_CLINIC_OR_DEPARTMENT_OTHER): Payer: Self-pay | Admitting: General Surgery

## 2020-01-15 ENCOUNTER — Ambulatory Visit (HOSPITAL_BASED_OUTPATIENT_CLINIC_OR_DEPARTMENT_OTHER)
Admission: RE | Admit: 2020-01-15 | Discharge: 2020-01-15 | Disposition: A | Discharge status: Home / Self Care | Payer: Managed Care, Other (non HMO) | Attending: General Surgery | Admitting: General Surgery

## 2020-01-15 DIAGNOSIS — N6012 Diffuse cystic mastopathy of left breast: Secondary | ICD-10-CM | POA: Diagnosis present

## 2020-01-15 DIAGNOSIS — F419 Anxiety disorder, unspecified: Secondary | ICD-10-CM | POA: Diagnosis not present

## 2020-01-15 DIAGNOSIS — Z885 Allergy status to narcotic agent status: Secondary | ICD-10-CM | POA: Insufficient documentation

## 2020-01-15 DIAGNOSIS — N6092 Unspecified benign mammary dysplasia of left breast: Secondary | ICD-10-CM

## 2020-01-15 DIAGNOSIS — N6011 Diffuse cystic mastopathy of right breast: Secondary | ICD-10-CM | POA: Insufficient documentation

## 2020-01-15 DIAGNOSIS — Z79899 Other long term (current) drug therapy: Secondary | ICD-10-CM | POA: Diagnosis not present

## 2020-01-15 DIAGNOSIS — Z79890 Hormone replacement therapy (postmenopausal): Secondary | ICD-10-CM | POA: Insufficient documentation

## 2020-01-15 DIAGNOSIS — E039 Hypothyroidism, unspecified: Secondary | ICD-10-CM | POA: Insufficient documentation

## 2020-01-15 DIAGNOSIS — Z881 Allergy status to other antibiotic agents status: Secondary | ICD-10-CM | POA: Insufficient documentation

## 2020-01-15 HISTORY — PX: BREAST EXCISIONAL BIOPSY: SUR124

## 2020-01-15 HISTORY — PX: BREAST LUMPECTOMY WITH RADIOACTIVE SEED LOCALIZATION: SHX6424

## 2020-01-15 SURGERY — BREAST LUMPECTOMY WITH RADIOACTIVE SEED LOCALIZATION
Anesthesia: General | Site: Breast | Laterality: Left

## 2020-01-15 MED ORDER — EPHEDRINE 5 MG/ML INJ
INTRAVENOUS | Status: AC
  Filled 2020-01-15: qty 10

## 2020-01-15 MED ORDER — FENTANYL CITRATE (PF) 100 MCG/2ML IJ SOLN
INTRAMUSCULAR | Status: DC | PRN
  Administered 2020-01-15 (×2): 25 ug via INTRAVENOUS

## 2020-01-15 MED ORDER — CHLORHEXIDINE GLUCONATE CLOTH 2 % EX PADS: 6.0000 | MEDICATED_PAD | Freq: Once | CUTANEOUS | Status: DC

## 2020-01-15 MED ORDER — DEXMEDETOMIDINE HCL IN NACL 200 MCG/50ML IV SOLN
INTRAVENOUS | Status: AC
  Filled 2020-01-15: qty 50

## 2020-01-15 MED ORDER — PROPOFOL 10 MG/ML IV BOLUS
INTRAVENOUS | Status: DC | PRN
  Administered 2020-01-15: 150 mg via INTRAVENOUS
  Administered 2020-01-15: 50 mg via INTRAVENOUS

## 2020-01-15 MED ORDER — MIDAZOLAM HCL 5 MG/5ML IJ SOLN
INTRAMUSCULAR | Status: DC | PRN
  Administered 2020-01-15: 2 mg via INTRAVENOUS

## 2020-01-15 MED ORDER — LACTATED RINGERS IV SOLN: INTRAVENOUS | Status: DC

## 2020-01-15 MED ORDER — CEFAZOLIN SODIUM-DEXTROSE 2-4 GM/100ML-% IV SOLN
2.0000 g | INTRAVENOUS | Status: AC
  Administered 2020-01-15: 2 g via INTRAVENOUS

## 2020-01-15 MED ORDER — ONDANSETRON HCL 4 MG/2ML IJ SOLN
INTRAMUSCULAR | Status: AC
  Filled 2020-01-15: qty 4

## 2020-01-15 MED ORDER — ACETAMINOPHEN 500 MG PO TABS
1000.0000 mg | ORAL_TABLET | ORAL | Status: AC
  Administered 2020-01-15: 07:00:00 1000 mg via ORAL

## 2020-01-15 MED ORDER — PROPOFOL 10 MG/ML IV BOLUS
INTRAVENOUS | Status: AC
  Filled 2020-01-15: qty 20

## 2020-01-15 MED ORDER — LIDOCAINE 2% (20 MG/ML) 5 ML SYRINGE
INTRAMUSCULAR | Status: AC
  Filled 2020-01-15: qty 10

## 2020-01-15 MED ORDER — EPHEDRINE SULFATE 50 MG/ML IJ SOLN
INTRAMUSCULAR | Status: DC | PRN
  Administered 2020-01-15 (×3): 5 mg via INTRAVENOUS

## 2020-01-15 MED ORDER — ACETAMINOPHEN 500 MG PO TABS
ORAL_TABLET | ORAL | Status: AC
  Filled 2020-01-15: qty 2

## 2020-01-15 MED ORDER — DEXAMETHASONE SODIUM PHOSPHATE 10 MG/ML IJ SOLN
INTRAMUSCULAR | Status: DC | PRN
  Administered 2020-01-15: 5 mg via INTRAVENOUS

## 2020-01-15 MED ORDER — CEFAZOLIN SODIUM-DEXTROSE 2-4 GM/100ML-% IV SOLN
INTRAVENOUS | Status: AC
  Filled 2020-01-15: qty 100

## 2020-01-15 MED ORDER — CELECOXIB 200 MG PO CAPS
ORAL_CAPSULE | ORAL | Status: AC
  Filled 2020-01-15: qty 1

## 2020-01-15 MED ORDER — TRAMADOL HCL 50 MG PO TABS: 50.0000 mg | ORAL_TABLET | Freq: Four times a day (QID) | ORAL | 0 refills | Status: DC | PRN

## 2020-01-15 MED ORDER — LIDOCAINE HCL (CARDIAC) PF 100 MG/5ML IV SOSY
PREFILLED_SYRINGE | INTRAVENOUS | Status: DC | PRN
  Administered 2020-01-15: 100 mg via INTRAVENOUS

## 2020-01-15 MED ORDER — SCOPOLAMINE 1 MG/3DAYS TD PT72
1.0000 | MEDICATED_PATCH | TRANSDERMAL | Status: DC
  Administered 2020-01-15: 1.5 mg via TRANSDERMAL

## 2020-01-15 MED ORDER — BUPIVACAINE HCL (PF) 0.25 % IJ SOLN
INTRAMUSCULAR | Status: AC
  Filled 2020-01-15: qty 30

## 2020-01-15 MED ORDER — DEXAMETHASONE SODIUM PHOSPHATE 10 MG/ML IJ SOLN
INTRAMUSCULAR | Status: AC
  Filled 2020-01-15: qty 2

## 2020-01-15 MED ORDER — BUPIVACAINE-EPINEPHRINE (PF) 0.25% -1:200000 IJ SOLN
INTRAMUSCULAR | Status: DC | PRN
  Administered 2020-01-15: 10 mL via PERINEURAL

## 2020-01-15 MED ORDER — ONDANSETRON HCL 4 MG/2ML IJ SOLN
INTRAMUSCULAR | Status: DC | PRN
  Administered 2020-01-15: 4 mg via INTRAVENOUS

## 2020-01-15 MED ORDER — SCOPOLAMINE 1 MG/3DAYS TD PT72
MEDICATED_PATCH | TRANSDERMAL | Status: AC
  Filled 2020-01-15: qty 1

## 2020-01-15 MED ORDER — MIDAZOLAM HCL 2 MG/2ML IJ SOLN
INTRAMUSCULAR | Status: AC
  Filled 2020-01-15: qty 2

## 2020-01-15 MED ORDER — GABAPENTIN 300 MG PO CAPS
ORAL_CAPSULE | ORAL | Status: AC
  Filled 2020-01-15: qty 1

## 2020-01-15 MED ORDER — GABAPENTIN 300 MG PO CAPS
300.0000 mg | ORAL_CAPSULE | ORAL | Status: AC
  Administered 2020-01-15: 300 mg via ORAL

## 2020-01-15 MED ORDER — CELECOXIB 200 MG PO CAPS
200.0000 mg | ORAL_CAPSULE | ORAL | Status: AC
  Administered 2020-01-15: 200 mg via ORAL

## 2020-01-15 MED ORDER — FENTANYL CITRATE (PF) 100 MCG/2ML IJ SOLN
INTRAMUSCULAR | Status: AC
  Filled 2020-01-15: qty 2

## 2020-01-15 SURGICAL SUPPLY — 42 items
APPLIER CLIP 9.375 MED OPEN (MISCELLANEOUS) ×2
BLADE SURG 15 STRL LF DISP TIS (BLADE) ×1 IMPLANT
BLADE SURG 15 STRL SS (BLADE) ×1
CANISTER SUC SOCK COL 7IN (MISCELLANEOUS) ×2 IMPLANT
CANISTER SUCT 1200ML W/VALVE (MISCELLANEOUS) ×2 IMPLANT
CHLORAPREP W/TINT 26 (MISCELLANEOUS) ×2 IMPLANT
CLIP APPLIE 9.375 MED OPEN (MISCELLANEOUS) ×1 IMPLANT
COVER BACK TABLE 60X90IN (DRAPES) ×2 IMPLANT
COVER MAYO STAND STRL (DRAPES) ×2 IMPLANT
COVER PROBE W GEL 5X96 (DRAPES) ×2 IMPLANT
COVER WAND RF STERILE (DRAPES) IMPLANT
DECANTER SPIKE VIAL GLASS SM (MISCELLANEOUS) IMPLANT
DERMABOND ADVANCED (GAUZE/BANDAGES/DRESSINGS) ×1
DERMABOND ADVANCED .7 DNX12 (GAUZE/BANDAGES/DRESSINGS) ×1 IMPLANT
DRAPE LAPAROSCOPIC ABDOMINAL (DRAPES) ×2 IMPLANT
DRAPE UTILITY XL STRL (DRAPES) ×2 IMPLANT
ELECT COATED BLADE 2.86 ST (ELECTRODE) ×2 IMPLANT
ELECT REM PT RETURN 9FT ADLT (ELECTROSURGICAL) ×2
ELECTRODE REM PT RTRN 9FT ADLT (ELECTROSURGICAL) ×1 IMPLANT
GLOVE BIO SURGEON STRL SZ 6.5 (GLOVE) ×2 IMPLANT
GLOVE BIO SURGEON STRL SZ7.5 (GLOVE) ×4 IMPLANT
GLOVE BIOGEL PI IND STRL 7.0 (GLOVE) ×2 IMPLANT
GLOVE BIOGEL PI INDICATOR 7.0 (GLOVE) ×2
GOWN STRL REUS W/ TWL LRG LVL3 (GOWN DISPOSABLE) ×2 IMPLANT
GOWN STRL REUS W/TWL LRG LVL3 (GOWN DISPOSABLE) ×2
ILLUMINATOR WAVEGUIDE N/F (MISCELLANEOUS) IMPLANT
KIT MARKER MARGIN INK (KITS) ×2 IMPLANT
LIGHT WAVEGUIDE WIDE FLAT (MISCELLANEOUS) IMPLANT
NEEDLE HYPO 25X1 1.5 SAFETY (NEEDLE) ×2 IMPLANT
NS IRRIG 1000ML POUR BTL (IV SOLUTION) ×2 IMPLANT
PACK BASIN DAY SURGERY FS (CUSTOM PROCEDURE TRAY) ×2 IMPLANT
PENCIL SMOKE EVACUATOR (MISCELLANEOUS) ×2 IMPLANT
SLEEVE SCD COMPRESS KNEE MED (MISCELLANEOUS) ×2 IMPLANT
SPONGE LAP 18X18 RF (DISPOSABLE) ×2 IMPLANT
SUT MON AB 4-0 PC3 18 (SUTURE) ×2 IMPLANT
SUT SILK 2 0 SH (SUTURE) IMPLANT
SUT VICRYL 3-0 CR8 SH (SUTURE) ×2 IMPLANT
SYR CONTROL 10ML LL (SYRINGE) ×2 IMPLANT
TOWEL GREEN STERILE FF (TOWEL DISPOSABLE) ×2 IMPLANT
TRAY FAXITRON CT DISP (TRAY / TRAY PROCEDURE) ×2 IMPLANT
TUBE CONNECTING 20X1/4 (TUBING) ×2 IMPLANT
YANKAUER SUCT BULB TIP NO VENT (SUCTIONS) ×2 IMPLANT

## 2020-01-15 NOTE — H&P (Signed)
Geraldo Docker  Location: [facility] Patient #: [id number] DOB: Apr 21, 1966 Single / Language: Cleophus Molt / Race: White Female   History of Present Illness The patient is a 54 year old female who presents with a complaint of Breast problems. We are asked to see the patient in consultation by Dr. 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 The patient is a 54 year old white female who recently went for a routine screening mammogram. At that time she was found to have a 4 mm cluster of abnormal calcification in the upper inner left breast. This was biopsied and came back as atypical duct hyperplasia. She denies any family history of breast cancer. She is otherwise in good health. She does not smoke and does not take hormone pills.   Past Surgical History  Breast Augmentation  Bilateral. Breast Biopsy  Left. Tonsillectomy   Diagnostic Studies History  Colonoscopy  >10 years ago Mammogram  within last year Pap Smear  1-5 years ago  Allergies  Erythromycin *OPHTHALMIC AGENTS*  Codeine Phosphate *ANALGESICS - OPIOID*  Allergies Reconciled   Medication History  Levothyroxine Sodium (75MCG Tablet, Oral) Active. PARoxetine HCl (10MG  Tablet, Oral) Active. Medications Reconciled  Social History  Alcohol use  Occasional alcohol use. Caffeine use  Carbonated beverages. No drug use  Tobacco use  Never smoker.  Family History  Arthritis  Sister. Diabetes Mellitus  Mother. Thyroid problems  Father, Sister.  Pregnancy / Birth History  Age at menarche  60 years. Gravida  0 Irregular periods   Other Problems Anxiety Disorder  Thyroid Disease     Review of Systems  General Not Present- Appetite Loss, Chills, Fatigue, Fever, Night Sweats, Weight Gain and Weight Loss. Skin Not Present- Change in Wart/Mole, Dryness, Hives, Jaundice, New Lesions, Non-Healing Wounds, Rash and Ulcer. HEENT Not Present- Earache,  Hearing Loss, Hoarseness, Nose Bleed, Oral Ulcers, Ringing in the Ears, Seasonal Allergies, Sinus Pain, Sore Throat, Visual Disturbances, Wears glasses/contact lenses and Yellow Eyes. Respiratory Not Present- Bloody sputum, Chronic Cough, Difficulty Breathing, Snoring and Wheezing. Breast Not Present- Breast Mass, Breast Pain, Nipple Discharge and Skin Changes. Cardiovascular Not Present- Chest Pain, Difficulty Breathing Lying Down, Leg Cramps, Palpitations, Rapid Heart Rate, Shortness of Breath and Swelling of Extremities. Gastrointestinal Not Present- Abdominal Pain, Bloating, Bloody Stool, Change in Bowel Habits, Chronic diarrhea, Constipation, Difficulty Swallowing, Excessive gas, Gets full quickly at meals, Hemorrhoids, Indigestion, Nausea, Rectal Pain and Vomiting. Female Genitourinary Not Present- Frequency, Nocturia, Painful Urination, Pelvic Pain and Urgency. Musculoskeletal Not Present- Back Pain, Joint Pain, Joint Stiffness, Muscle Pain, Muscle Weakness and Swelling of Extremities. Neurological Not Present- Decreased Memory, Fainting, Headaches, Numbness, Seizures, Tingling, Tremor, Trouble walking and Weakness. Psychiatric Not Present- Anxiety, Bipolar, Change in Sleep Pattern, Depression, Fearful and Frequent crying. Endocrine Not Present- Cold Intolerance, Excessive Hunger, Hair Changes, Heat Intolerance, Hot flashes and New Diabetes. Hematology Not Present- Blood Thinners, Easy Bruising, Excessive bleeding, Gland problems, HIV and Persistent Infections.  Vitals  Weight: 162 lb Height: 65in Body Surface Area: 1.81 m Body Mass Index: 26.96 kg/m  Temp.: 51F  Pulse: 88 (Regular)  BP: 116/72 (Sitting, Left Arm, Standard)       Physical Exam General Mental Status-Alert. General Appearance-Consistent with stated age. Hydration-Well hydrated. Voice-Normal.  Head and Neck Head-normocephalic, atraumatic with no lesions or palpable  masses. Trachea-midline. Thyroid Gland Characteristics - normal size and consistency.  Eye Eyeball - Bilateral-Extraocular movements intact. Sclera/Conjunctiva - Bilateral-No scleral icterus.  Chest and Lung Exam  Chest and lung exam reveals -quiet, even and easy respiratory effort with no use of accessory muscles and on auscultation, normal breath sounds, no adventitious sounds and normal vocal resonance. Inspection Chest Wall - Normal. Back - normal.  Breast Note: There is no palpable mass in either breast. There is no palpable axillary, supraclavicular, or cervical lymphadenopathy. She has had bilateral breast augmentation with implants that she thinks is behind the muscle.   Cardiovascular Cardiovascular examination reveals -normal heart sounds, regular rate and rhythm with no murmurs and normal pedal pulses bilaterally.  Abdomen Inspection Inspection of the abdomen reveals - No Hernias. Skin - Scar - no surgical scars. Palpation/Percussion Palpation and Percussion of the abdomen reveal - Soft, Non Tender, No Rebound tenderness, No Rigidity (guarding) and No hepatosplenomegaly. Auscultation Auscultation of the abdomen reveals - Bowel sounds normal.  Neurologic Neurologic evaluation reveals -alert and oriented x 3 with no impairment of recent or remote memory. Mental Status-Normal.  Musculoskeletal Normal Exam - Left-Upper Extremity Strength Normal and Lower Extremity Strength Normal. Normal Exam - Right-Upper Extremity Strength Normal and Lower Extremity Strength Normal.  Lymphatic Head & Neck  General Head & Neck Lymphatics: Bilateral - Description - Normal. Axillary  General Axillary Region: Bilateral - Description - Normal. Tenderness - Non Tender. Femoral & Inguinal  Generalized Femoral & Inguinal Lymphatics: Bilateral - Description - Normal. Tenderness - Non Tender.    Assessment & Plan  ATYPICAL DUCTAL HYPERPLASIA OF LEFT BREAST  (N60.92) Impression: The patient appears to have a 4 mm area of atypical ductal hyperplasia in the upper inner quadrant of the left breast. This is considered a high risk lesion and when applied to the wrist calculator's estimates her lifetime risk of breast cancer at about 35%. Because of this and because atypical ductal hyperplasia can have an appearance similar to ductal carcinoma in situ I would recommend that the area be removed. I have discussed with her in detail the risks and benefits of the operation as well as some of the technical aspects including the use of a radioactive seed for localization and she understands and wishes to proceed. I will also refer her to the high-risk clinic at the cancer center to talk about risk reduction. With this level of wrist she will also likely qualify for annual MRI in addition to mammogram for screening. This patient encounter took 45 minutes today to perform the following: take history, perform exam, review outside records, interpret imaging, counsel the patient on their diagnosis and document encounter, findings & plan in the EHR Current Plans Referred to Oncology, for evaluation and follow up (Oncology). Routine.

## 2020-01-15 NOTE — Discharge Instructions (Signed)
  Post Anesthesia Home Care Instructions  Activity: Get plenty of rest for the remainder of the day. A responsible individual must stay with you for 24 hours following the procedure.  For the next 24 hours, DO NOT: -Drive a car -Operate machinery -Drink alcoholic beverages -Take any medication unless instructed by your physician -Make any legal decisions or sign important papers.  Meals: Start with liquid foods such as gelatin or soup. Progress to regular foods as tolerated. Avoid greasy, spicy, heavy foods. If nausea and/or vomiting occur, drink only clear liquids until the nausea and/or vomiting subsides. Call your physician if vomiting continues.  Special Instructions/Symptoms: Your throat may feel dry or sore from the anesthesia or the breathing tube placed in your throat during surgery. If this causes discomfort, gargle with warm salt water. The discomfort should disappear within 24 hours.  If you had a scopolamine patch placed behind your ear for the management of post- operative nausea and/or vomiting:  1. The medication in the patch is effective for 72 hours, after which it should be removed.  Wrap patch in a tissue and discard in the trash. Wash hands thoroughly with soap and water. 2. You may remove the patch earlier than 72 hours if you experience unpleasant side effects which may include dry mouth, dizziness or visual disturbances. 3. Avoid touching the patch. Wash your hands with soap and water after contact with the patch.    No Tylenol until after 1:30pm today. 

## 2020-01-15 NOTE — Op Note (Signed)
01/15/2020  9:19 AM  PATIENT:  [name]  54 y.o. female  PRE-OPERATIVE DIAGNOSIS:   left breast atypical ductal  hyperplasia  POST-OPERATIVE DIAGNOSIS:   left breast atypical ductal  hyperplasia  PROCEDURE:  Procedure(s): LEFT BREAST LUMPECTOMY WITH RADIOACTIVE SEED LOCALIZATION (Left)  SURGEON:  Surgeon(s) and Role:    Jovita Kussmaul, MD - Primary  PHYSICIAN ASSISTANT:   ASSISTANTS: none   ANESTHESIA:   local and general  EBL:  minimal   BLOOD ADMINISTERED:none  DRAINS: none   LOCAL MEDICATIONS USED:  MARCAINE     SPECIMEN:  Source of Specimen:  left breast tissue with additional superior margin  DISPOSITION OF SPECIMEN:  PATHOLOGY  COUNTS:  YES  TOURNIQUET:  * No tourniquets in log *  DICTATION: .Dragon Dictation   After informed consent was obtained the patient was brought to the operating room and placed in the supine position on the operating table.  After adequate induction of general anesthesia the patient's left breast was prepped with ChloraPrep, allowed to dry, and draped in usual sterile manner.  An appropriate timeout was performed.  Previously an I-125 seed was placed in the upper inner quadrant of the left breast to mark an area of atypical ductal hyperplasia.  The neoprobe was set to I-125 in the area of radioactivity was readily identified.  The area around this was infiltrated with quarter percent Marcaine.  A curvilinear incision was made along the upper inner edge of the areola of the left breast.  The incision was carried through the skin and subcutaneous tissue sharply with the electrocautery.  Dissection was then carried out towards the radioactive seed sharply with the electrocautery under the direction of the neoprobe.  Once I more closely approached the radioactive seed I then removed a circular portion of breast tissue sharply with the electrocautery around the radioactive seed while checking the area of radioactivity frequently.  Once the  specimen was removed it was oriented with the appropriate paint colors.  A specimen radiograph was obtained that showed the clip and seed to be near the superior edge of the lumpectomy specimen.  An additional superior margin was removed sharply with the electrocautery and marked appropriately.  All of this tissue was then sent to pathology for further evaluation.  Hemostasis was achieved using the Bovie electrocautery.  The dissection was carried all the way down to the capsule of the implant.  The breast tissue was not very thick.  Hemostasis was achieved using the Bovie electrocautery.  The wound was irrigated with saline.  The deep layer of the wound was then closed with interrupted 3-0 Vicryl stitches.  The skin was closed with interrupted 4-0 Monocryl subcuticular stitches.  Dermabond dressings were applied.  The patient tolerated the procedure well.  At the end of the case all needle sponge and instrument counts were correct.  The patient was then awakened and taken to recovery in stable condition.  PLAN OF CARE: Discharge to home after PACU  PATIENT DISPOSITION:  PACU - hemodynamically stable.   Delay start of Pharmacological VTE agent (>24hrs) due to surgical blood loss or risk of bleeding: not applicable

## 2020-01-15 NOTE — Anesthesia Procedure Notes (Signed)
Procedure Name: LMA Insertion Date/Time: 01/15/2020 8:39 AM Performed by: Lavonia Dana, CRNA Pre-anesthesia Checklist: Patient identified, Emergency Drugs available, Suction available and Patient being monitored Patient Re-evaluated:Patient Re-evaluated prior to induction Oxygen Delivery Method: Circle system utilized Preoxygenation: Pre-oxygenation with 100% oxygen Induction Type: IV induction Ventilation: Mask ventilation without difficulty LMA: LMA inserted LMA Size: 4.0 Number of attempts: 1 Airway Equipment and Method: Bite block Placement Confirmation: positive ETCO2 Tube secured with: Tape Dental Injury: Teeth and Oropharynx as per pre-operative assessment

## 2020-01-15 NOTE — Transfer of Care (Signed)
Immediate Anesthesia Transfer of Care Note  Patient: [name]  Procedure(s) Performed: LEFT BREAST LUMPECTOMY WITH RADIOACTIVE SEED LOCALIZATION (Left Breast)  Patient Location: PACU  Anesthesia Type:General  Level of Consciousness: awake, alert  and oriented  Airway & Oxygen Therapy: Patient Spontanous Breathing and Patient connected to face mask oxygen  Post-op Assessment: Report given to RN and Post -op Vital signs reviewed and stable  Post vital signs: Reviewed and stable  Last Vitals:  Vitals Value Taken Time  BP 123/79 01/15/20 0928  Temp    Pulse 76 01/15/20 0929  Resp 10 01/15/20 0929  SpO2 100 % 01/15/20 0929  Vitals shown include unvalidated device data.  Last Pain:  Vitals:   01/15/20 0715  TempSrc: Tympanic  PainSc: 0-No pain      Patients Stated Pain Goal: 10 ([phone])  Complications: No apparent anesthesia complications

## 2020-01-15 NOTE — Interval H&P Note (Signed)
History and Physical Interval Note:  01/15/2020 8:16 AM  [name]  has presented today for surgery, with the diagnosis of left breast adh.  The various methods of treatment have been discussed with the patient and family. After consideration of risks, benefits and other options for treatment, the patient has consented to  Procedure(s): LEFT BREAST LUMPECTOMY WITH RADIOACTIVE SEED LOCALIZATION (Left) as a surgical intervention.  The patient's history has been reviewed, patient examined, no change in status, stable for surgery.  I have reviewed the patient's chart and labs.  Questions were answered to the patient's satisfaction.     Autumn Messing III

## 2020-01-17 NOTE — Anesthesia Postprocedure Evaluation (Signed)
Anesthesia Post Note  Patient: [name]  Procedure(s) Performed: LEFT BREAST LUMPECTOMY WITH RADIOACTIVE SEED LOCALIZATION (Left Breast)     Patient location during evaluation: PACU Anesthesia Type: General Level of consciousness: awake and alert Pain management: pain level controlled Vital Signs Assessment: post-procedure vital signs reviewed and stable Respiratory status: spontaneous breathing, nonlabored ventilation, respiratory function stable and patient connected to nasal cannula oxygen Cardiovascular status: blood pressure returned to baseline and stable Postop Assessment: no apparent nausea or vomiting Anesthetic complications: no    Last Vitals:  Vitals:   01/15/20 0945 01/15/20 1015  BP: 117/64 130/76  Pulse: 70 68  Resp: 13 18  Temp:  36.6 C  SpO2: 99% 98%    Last Pain:  Vitals:   01/15/20 1015  TempSrc:   PainSc: 0-No pain                 Jamal Haskin

## 2020-01-18 ENCOUNTER — Encounter: Payer: Self-pay | Admitting: *Deleted

## 2020-01-19 LAB — SURGICAL PATHOLOGY

## 2020-01-20 ENCOUNTER — Telehealth (INDEPENDENT_AMBULATORY_CARE_PROVIDER_SITE_OTHER): Payer: Managed Care, Other (non HMO) | Admitting: Osteopathic Medicine

## 2020-01-20 ENCOUNTER — Encounter: Payer: Self-pay | Admitting: Osteopathic Medicine

## 2020-01-20 VITALS — Wt 155.0 lb

## 2020-01-20 DIAGNOSIS — F418 Other specified anxiety disorders: Secondary | ICD-10-CM | POA: Diagnosis not present

## 2020-01-20 MED ORDER — ESCITALOPRAM OXALATE 10 MG PO TABS: 10.0000 mg | ORAL_TABLET | Freq: Every day | ORAL | 3 refills | Status: DC

## 2020-01-20 NOTE — Progress Notes (Signed)
Virtual Visit via Video (App used: Doximity & phone) Note  I connected with      [name] on 01/20/20 at 3:14 PM  by a telemedicine application and verified that I am speaking with the correct person using two identifiers.  Patient is at home I am in office   I discussed the limitations of evaluation and management by telemedicine and the availability of in person appointments. The patient expressed understanding and agreed to proceed.  History of Present Illness: [name] is a 54 y.o. female who would like to discuss mental health     Anxiety with depression LAST VISIT 12/07/19  Worsening fatigue and weight gain, anxiety/depression  Pt attributes some to menopause, possible to Paxil  No hot flashes  Awaiting lumpectomy results - will hold off on estrogen  -->Will trial lexapro in lieu of paxil and see if this helps (hopefully will help sleep, weight)  TODAY 01/20/20  PHQ/GAD as below - a bit more depressive symptoms, improved anxiety symptoms - pt feels overall better and would like to stay on crrent medications.      Depression screen [facility] 2/9 01/20/2020 12/07/2019 06/22/2019  Decreased Interest 2 1 0  Down, Depressed, Hopeless 2 1 1   PHQ - 2 Score 4 2 1   Altered sleeping 3 1 3   Tired, decreased energy 3 1 3   Change in appetite 0 0 1  Feeling bad or failure about yourself  0 1 0  Trouble concentrating 0 0 0  Moving slowly or fidgety/restless 0 0 0  Suicidal thoughts 0 0 0  PHQ-9 Score 10 5 8   Difficult doing work/chores Not difficult at all Not difficult at all Not difficult at all   GAD 7 : Generalized Anxiety Score 01/20/2020 12/07/2019 06/22/2019 05/12/2019  Nervous, Anxious, on Edge 2 3 1 3   Control/stop worrying 2 3 0 3  Worry too much - different things 2 2 1 3   Trouble relaxing 2 3 0 3  Restless 0 0 0 0  Easily annoyed or irritable 0 0 0 0  Afraid - awful might happen 0 2 0 1  Total GAD 7 Score 8 13 2 13   Anxiety Difficulty Not difficult at  all Not difficult at all Not difficult at all -      Observations/Objective: Wt 155 lb (70.3 kg)   LMP 11/01/2017 (Exact Date)   BMI 25.79 kg/m  BP Readings from Last 3 Encounters:  01/15/20 130/76  12/07/19 118/77  06/22/19 113/74   Exam: Normal Speech.  NAD  Lab and Radiology Results No results found for this or any previous visit (from the past 72 hour(s)). No results found.     Assessment and Plan: 54 y.o. female with The encounter diagnosis was Anxiety with depression.   PDMP not reviewed this encounter. No orders of the defined types were placed in this encounter.  Meds ordered this encounter  Medications  . escitalopram (LEXAPRO) 10 MG tablet    Sig: Take 1 tablet (10 mg total) by mouth at bedtime.    Dispense:  90 tablet    Refill:  3     Follow Up Instructions: Return in about 6 months (around 07/22/2020) for ANNUAL (call week prior to visit for lab orders).    I discussed the assessment and treatment plan with the patient. The patient was provided an opportunity to ask questions and all were answered. The patient agreed with the plan and demonstrated an understanding of the instructions.  The patient was advised to call back or seek an in-person evaluation if any new concerns, if symptoms worsen or if the condition fails to improve as anticipated.  20 minutes of non-face-to-face time was provided during this encounter.      . . . . . . . . . . . . . Marland Kitchen                   Historical information moved to improve visibility of documentation.  Past Medical History:  Diagnosis Date  . Abnormal pap   . Abnormal uterine bleeding (AUB) 09/09/2017  . History of hiatal hernia   . Hyperlipidemia   . Hypothyroidism due to Hashimoto's thyroiditis 05/22/2017  . IBS (irritable bowel syndrome)   . LGSIL on Pap smear of cervix 2012  . Metatarsalgia of left foot   . Neuritis   . PONV (postoperative nausea and vomiting)    Past  Surgical History:  Procedure Laterality Date  . AUGMENTATION MAMMAPLASTY    . BREAST LUMPECTOMY WITH RADIOACTIVE SEED LOCALIZATION Left 01/15/2020   Procedure: LEFT BREAST LUMPECTOMY WITH RADIOACTIVE SEED LOCALIZATION;  Surgeon: Jovita Kussmaul, MD;  Location: [place];  Service: General;  Laterality: Left;  . DILITATION & CURRETTAGE/HYSTROSCOPY WITH HYDROTHERMAL ABLATION N/A 11/13/2017   Procedure: DILATATION & CURETTAGE/HYSTEROSCOPY WITH HYDROTHERMAL ABLATION;  Surgeon: Emily Filbert, MD;  Location: [place];  Service: Gynecology;  Laterality: N/A;  . ENDOMETRIAL BIOPSY    . microdiskectomy  2007   L spine, done in [place]  . PLACEMENT OF BREAST IMPLANTS    . TONSILLECTOMY    . UPPER GI ENDOSCOPY  2008  . WISDOM TOOTH EXTRACTION     Social History   Tobacco Use  . Smoking status: Never Smoker  . Smokeless tobacco: Never Used  Substance Use Topics  . Alcohol use: Yes    Alcohol/week: 3.0 standard drinks    Types: 3 Cans of beer per week   family history includes Celiac disease in her brother; Diabetes in her mother; Thyroid disease in her sister.  Medications: Current Outpatient Medications  Medication Sig Dispense Refill  . escitalopram (LEXAPRO) 10 MG tablet Take 1 tablet (10 mg total) by mouth at bedtime. 90 tablet 3  . levothyroxine (SYNTHROID) 75 MCG tablet Take 1 tablet (75 mcg total) by mouth daily before breakfast. 30 tablet 0  . traMADol (ULTRAM) 50 MG tablet Take 1-2 tablets (50-100 mg total) by mouth every 6 (six) hours as needed. 20 tablet 0   No current facility-administered medications for this visit.   Allergies  Allergen Reactions  . Codeine   . Erythromycin

## 2020-02-08 ENCOUNTER — Other Ambulatory Visit: Payer: Self-pay

## 2020-02-08 DIAGNOSIS — E039 Hypothyroidism, unspecified: Secondary | ICD-10-CM

## 2020-02-08 DIAGNOSIS — E038 Other specified hypothyroidism: Secondary | ICD-10-CM

## 2020-02-08 MED ORDER — LEVOTHYROXINE SODIUM 75 MCG PO TABS: 75.0000 ug | ORAL_TABLET | Freq: Every day | ORAL | 0 refills | Status: DC

## 2020-03-11 ENCOUNTER — Encounter: Payer: Self-pay | Admitting: Medical-Surgical

## 2020-03-11 ENCOUNTER — Ambulatory Visit (INDEPENDENT_AMBULATORY_CARE_PROVIDER_SITE_OTHER): Payer: Managed Care, Other (non HMO) | Admitting: Medical-Surgical

## 2020-03-11 VITALS — Wt 155.0 lb

## 2020-03-11 DIAGNOSIS — Z23 Encounter for immunization: Secondary | ICD-10-CM | POA: Diagnosis not present

## 2020-03-11 NOTE — Progress Notes (Addendum)
Patient presents today for 2nd shingles vaccine. Injection given in left arm. Tolerated well. No immediate signs of reaction. No previous reaction stated.

## 2020-03-11 NOTE — Addendum Note (Signed)
Addended by: Peggye Ley on: 03/11/2020 03:37 PM   Modules accepted: Level of Service

## 2020-03-21 ENCOUNTER — Other Ambulatory Visit: Payer: Self-pay

## 2020-03-21 DIAGNOSIS — E038 Other specified hypothyroidism: Secondary | ICD-10-CM

## 2020-03-21 MED ORDER — LEVOTHYROXINE SODIUM 75 MCG PO TABS: 75.0000 ug | ORAL_TABLET | Freq: Every day | ORAL | 0 refills | Status: DC

## 2020-03-30 IMAGING — MG MM PLC BREAST LOC DEV 1ST LESION INC MAMMO GUIDE*L*
7 series · 7 of 7 positions shown · non-contrast
Comparison: Previous exam(s).

CLINICAL DATA: 53-year-old female with recently diagnosed left
breast atypical ductal hyperplasia presents for preoperative
radioactive seed localization.

EXAM:
MAMMOGRAPHIC GUIDED RADIOACTIVE SEED LOCALIZATION OF THE LEFT BREAST

[L CC (1 of 3)]
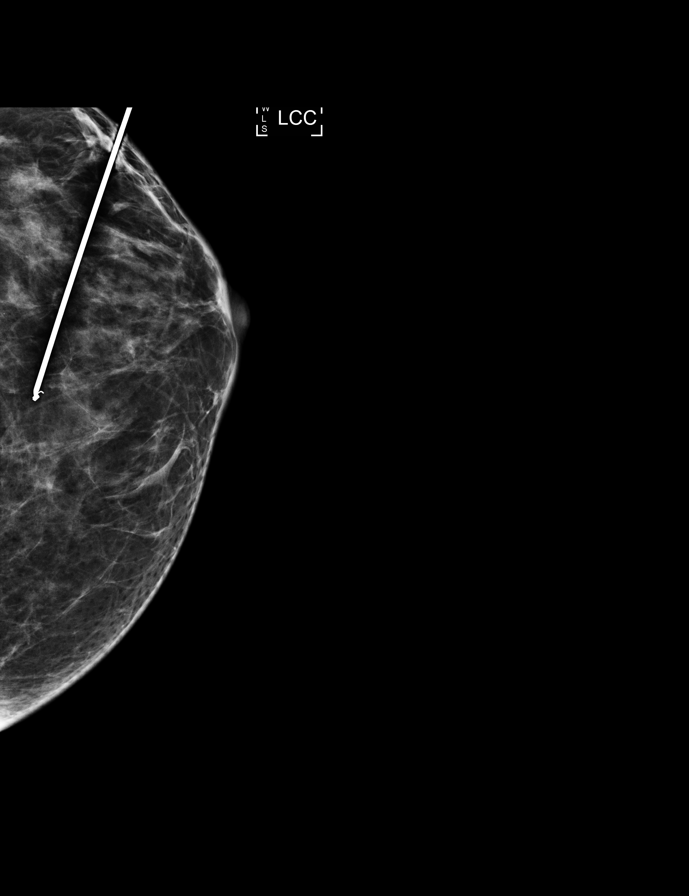

[L LM (1 of 4)]
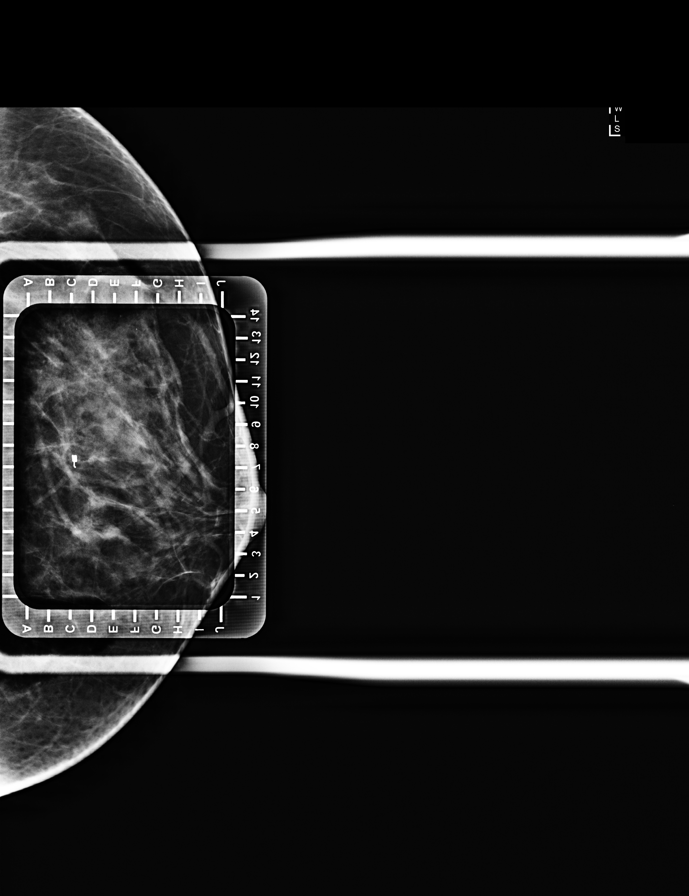

[L LM (2 of 4)]
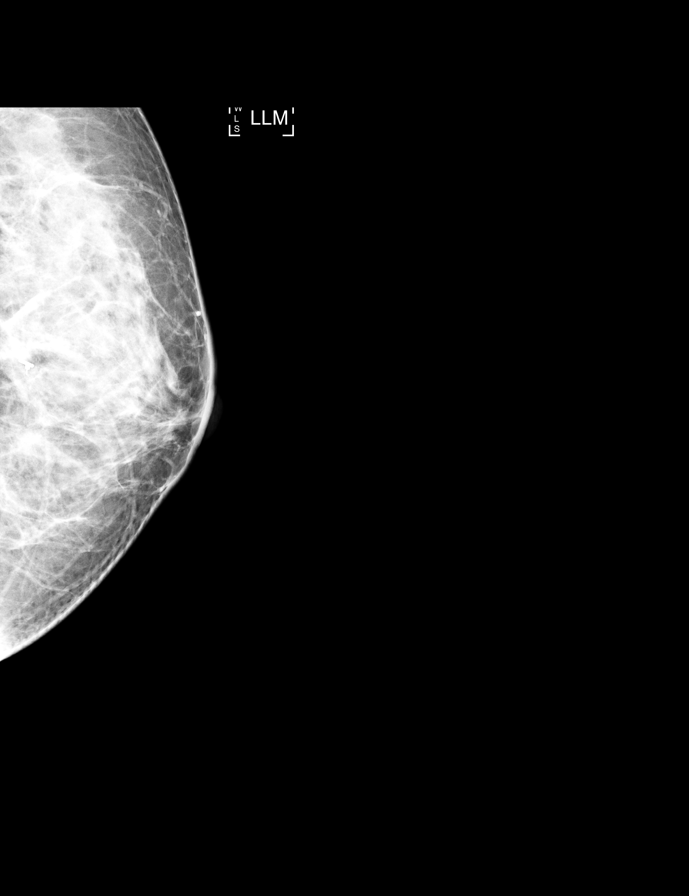

[L LM (3 of 4)]
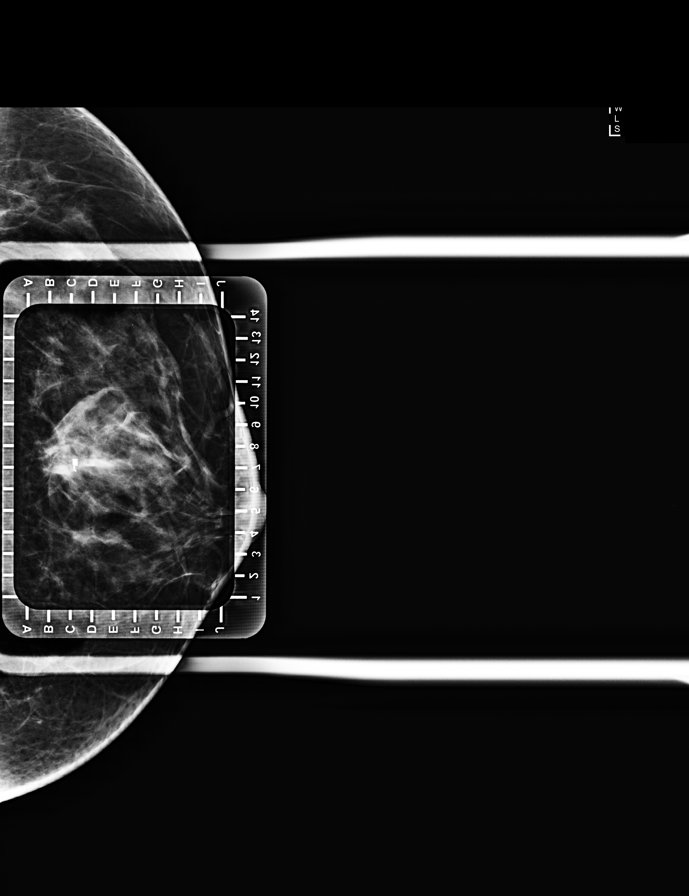

[L CC (2 of 3)]
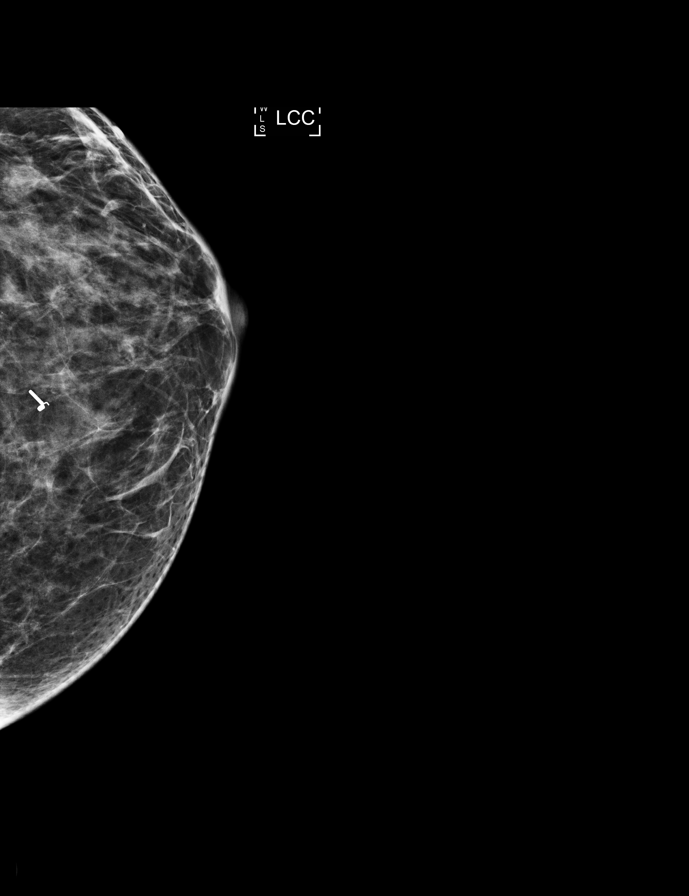

[L LM (4 of 4)]
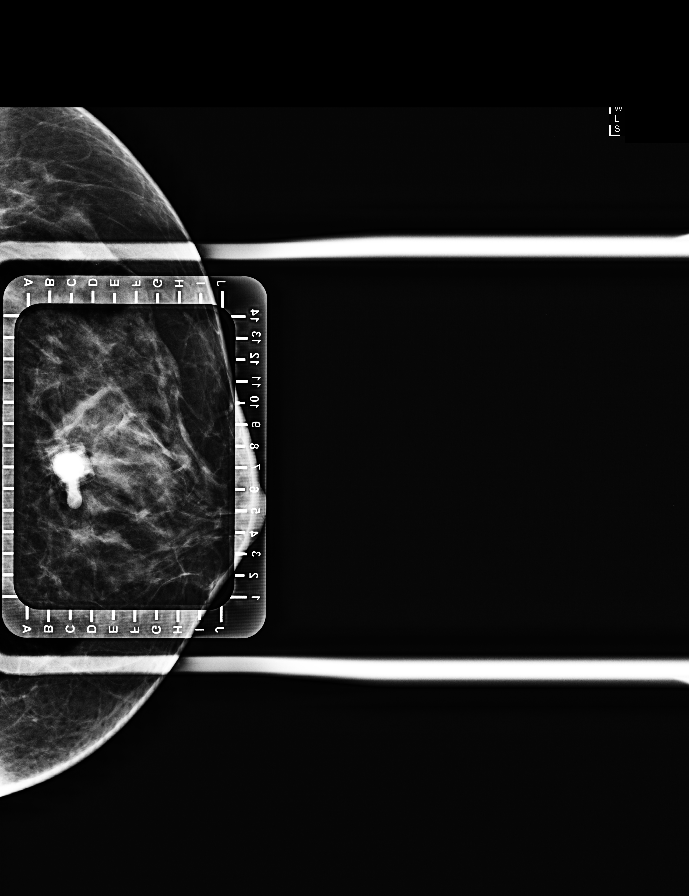

[L CC (3 of 3)]
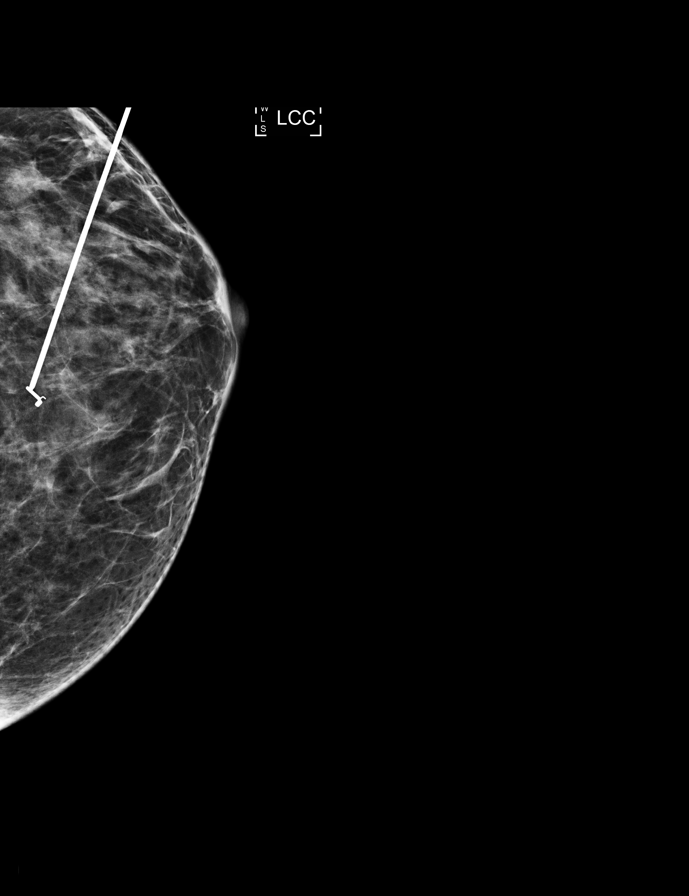

[7 of 7 positions shown; findings below may reference images not displayed]

FINDINGS: Patient presents for radioactive seed localization prior to left
breast excision. I met with the patient and we discussed the
procedure of seed localization including benefits and alternatives.
We discussed the high likelihood of a successful procedure. We
discussed the risks of the procedure including infection, bleeding,
tissue injury and further surgery. We discussed the low dose of
radioactivity involved in the procedure. Informed, written consent
was given.

The usual time-out protocol was performed immediately prior to the
procedure.

Using mammographic guidance, sterile technique, 1% lidocaine and an
O-[id number] radioactive seed, the coil shaped biopsy marking clip was
localized using a lateral to medial approach. The follow-up
mammogram images confirm the seed in the expected location and were
marked for Dr. Damha.

Follow-up survey of the patient confirms presence of the radioactive
seed.

Order number of O-I9X seed:  [id number].

Total activity:  0.242 millicuries reference Date: 01/08/2020

The patient tolerated the procedure well and was released from the
[REDACTED]. She was given instructions regarding seed removal.
IMPRESSION: Radioactive seed localization left breast. No apparent
complications.

## 2020-03-31 IMAGING — DX MM BREAST SURGICAL SPECIMEN
1 series · 2 of 2 positions shown · non-contrast
Comparison: Previous exam(s).

CLINICAL DATA: Left breast excision for recently diagnosed atypical
ductal hyperplasia.

EXAM:
SPECIMEN RADIOGRAPH OF THE LEFT BREAST

[Series 2: specimen digital x-ray, derived · left · 0.10mm/px · 2 of 2 slices shown]
[im 1/2]
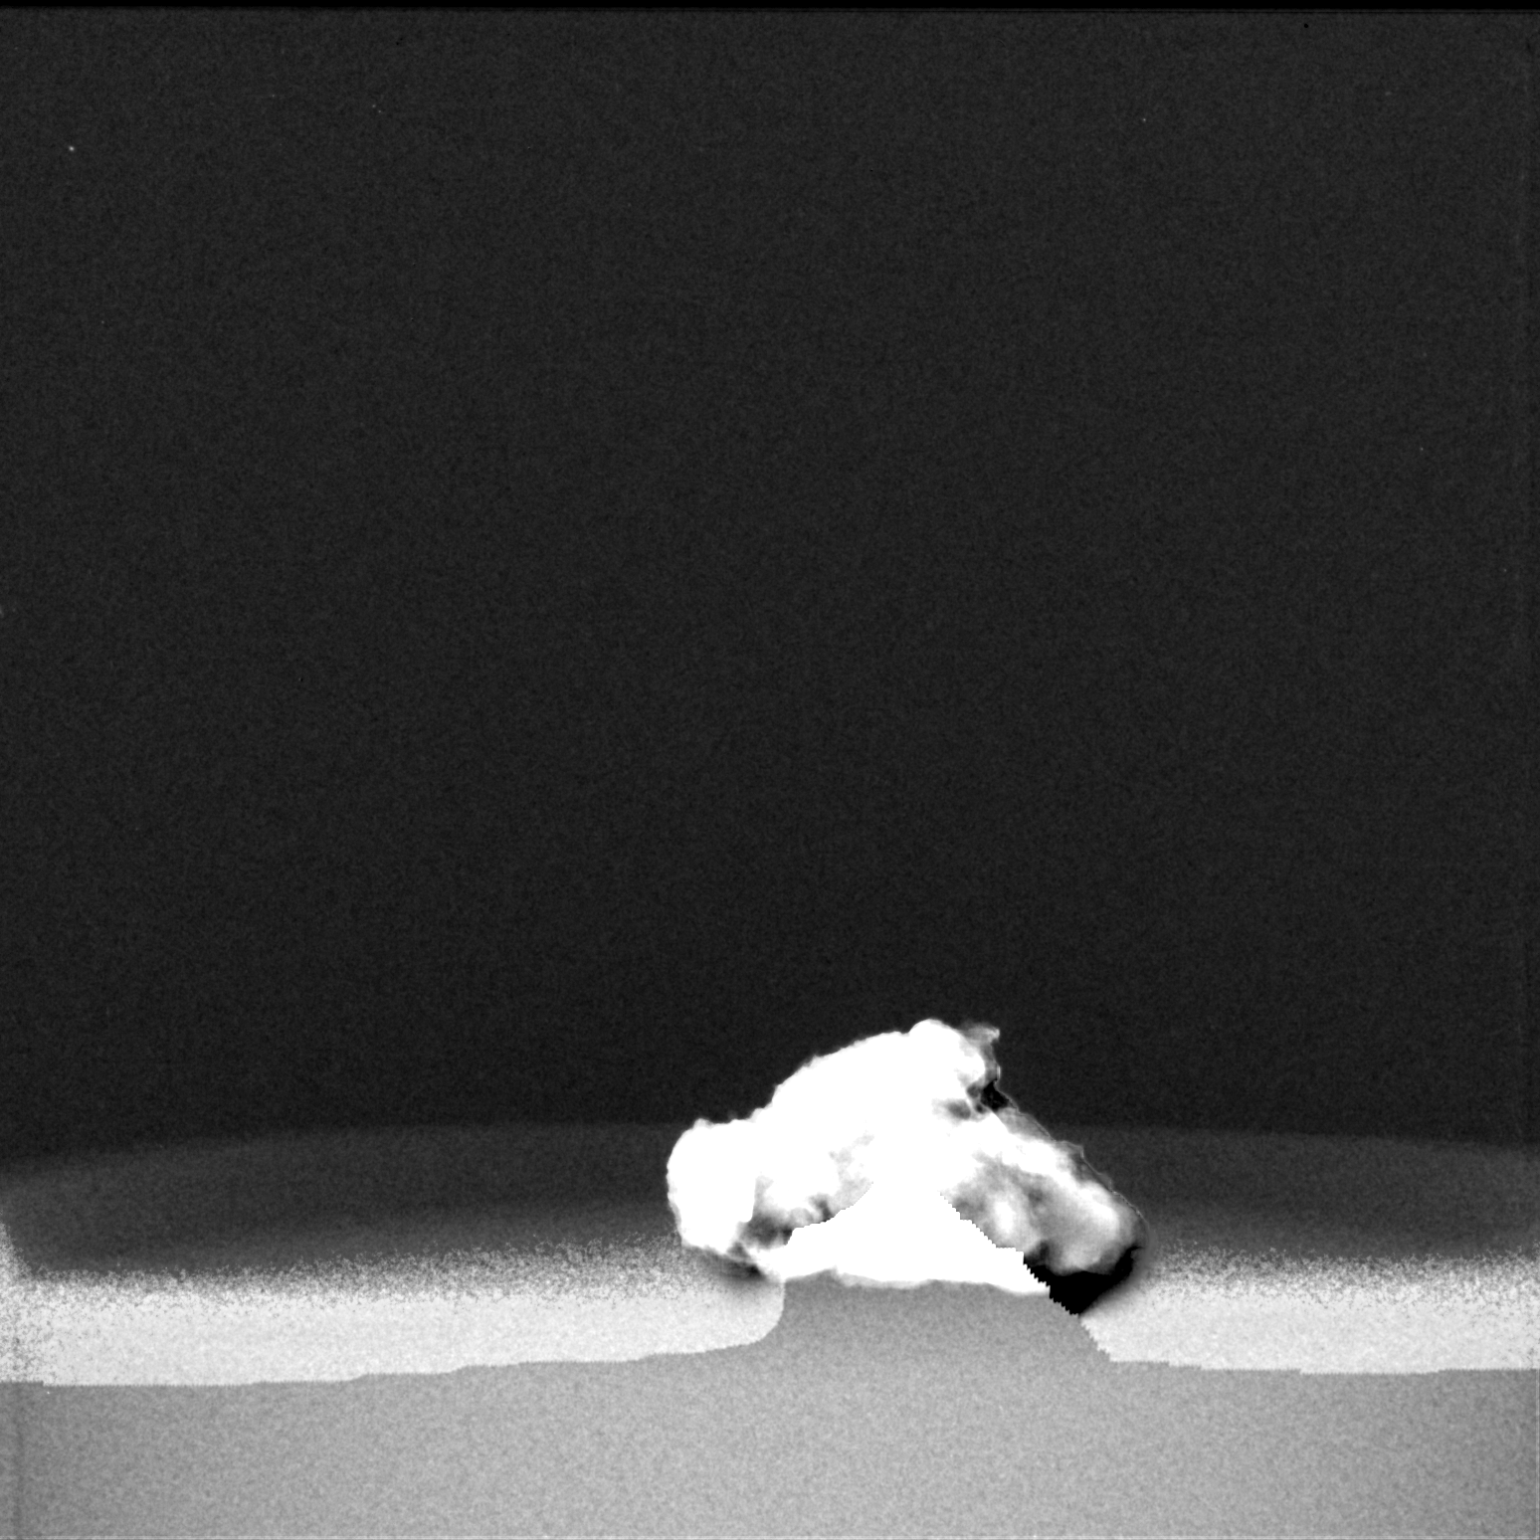
[im 2/2]
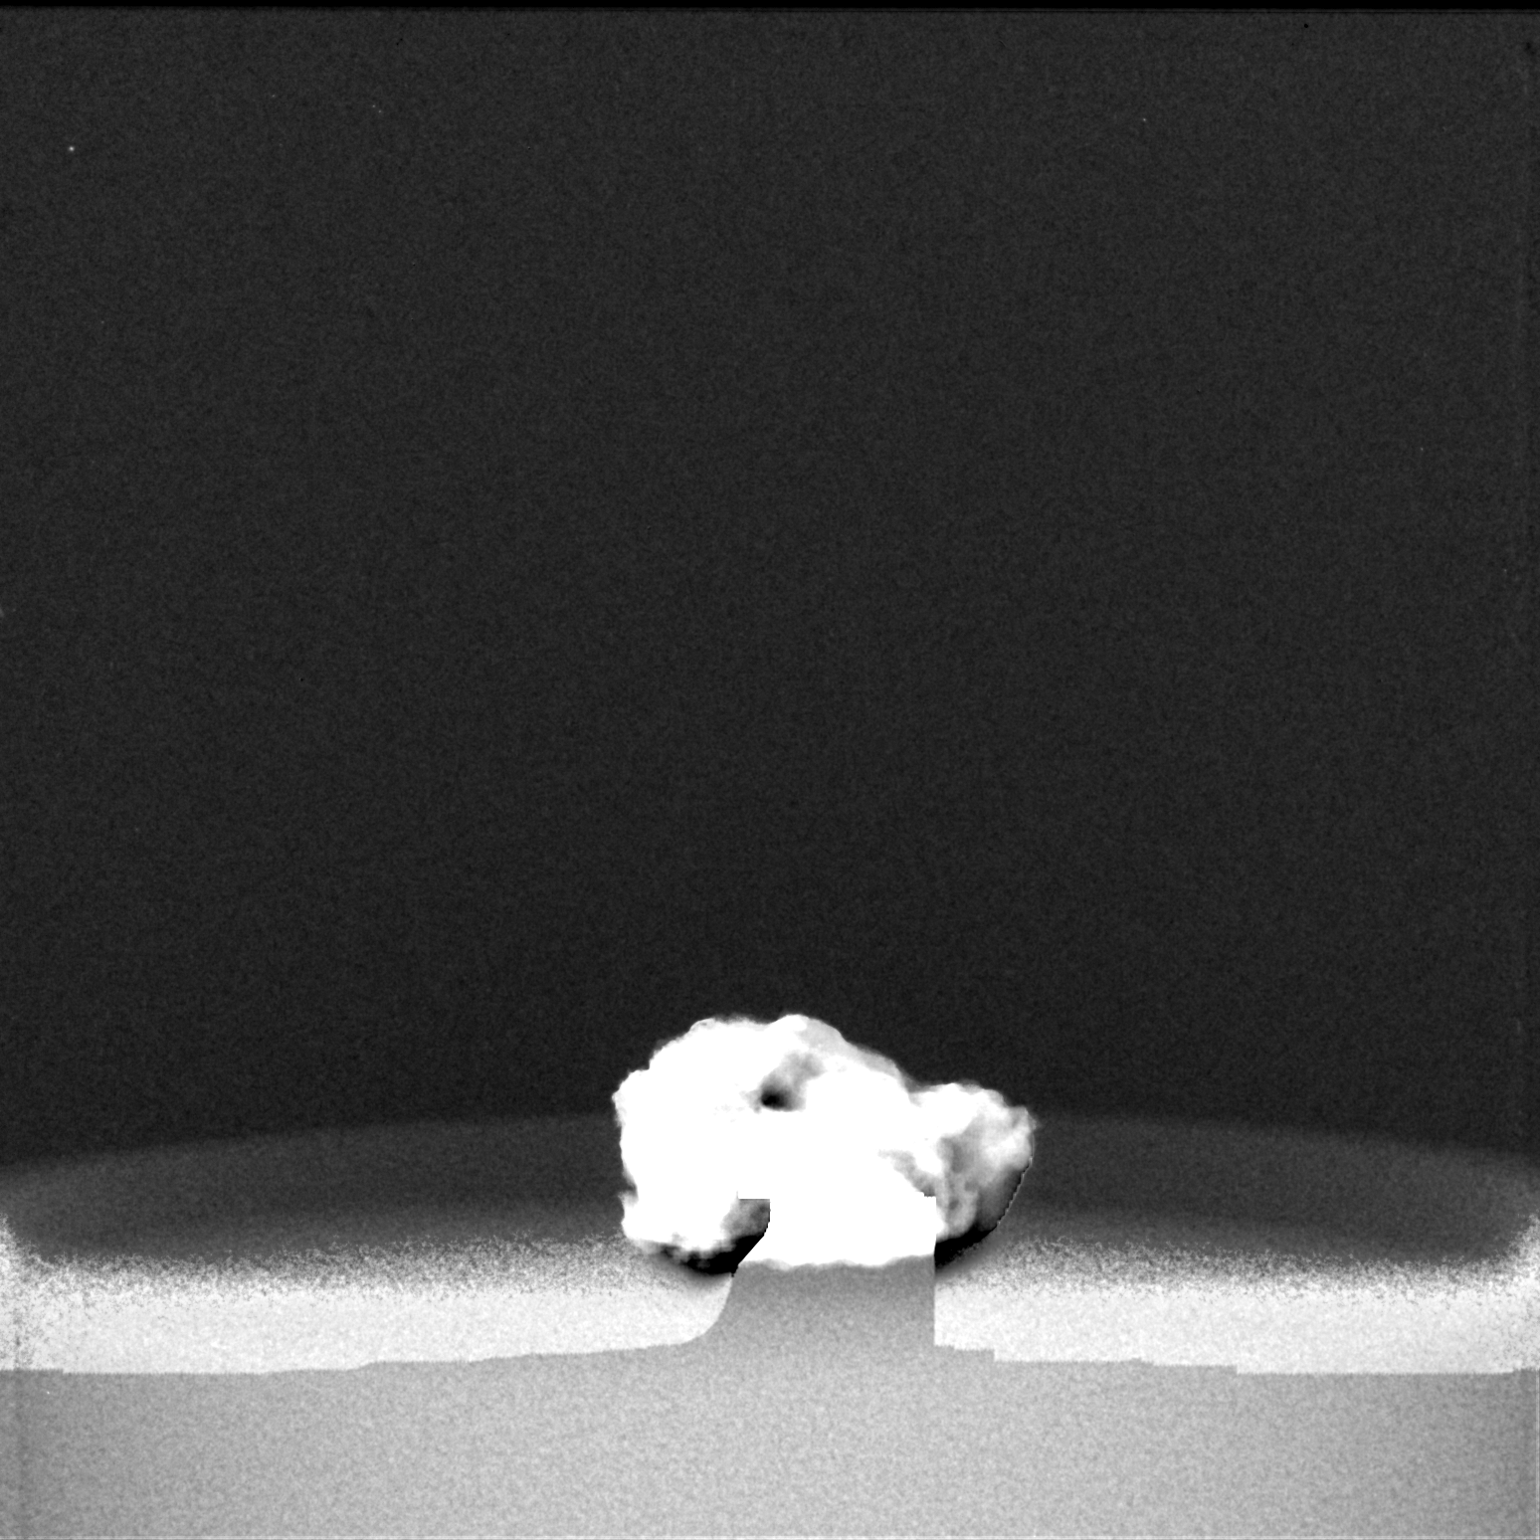

[2 of 2 positions shown; findings below may reference images not displayed]

FINDINGS: Status post excision of the left breast. The radioactive seed and
coil shaped biopsy marker clip are present, completely intact.
IMPRESSION: Specimen radiograph of the left breast.

## 2020-04-25 ENCOUNTER — Other Ambulatory Visit: Payer: Self-pay

## 2020-04-25 DIAGNOSIS — E038 Other specified hypothyroidism: Secondary | ICD-10-CM

## 2020-04-25 MED ORDER — LEVOTHYROXINE SODIUM 75 MCG PO TABS: 75.0000 ug | ORAL_TABLET | Freq: Every day | ORAL | 0 refills | Status: DC

## 2020-05-24 ENCOUNTER — Other Ambulatory Visit: Payer: Self-pay | Admitting: Osteopathic Medicine

## 2020-05-24 DIAGNOSIS — E038 Other specified hypothyroidism: Secondary | ICD-10-CM

## 2020-05-24 DIAGNOSIS — E063 Autoimmune thyroiditis: Secondary | ICD-10-CM

## 2020-06-27 ENCOUNTER — Other Ambulatory Visit: Payer: Self-pay | Admitting: Osteopathic Medicine

## 2020-06-27 DIAGNOSIS — E038 Other specified hypothyroidism: Secondary | ICD-10-CM

## 2020-06-27 DIAGNOSIS — E063 Autoimmune thyroiditis: Secondary | ICD-10-CM

## 2020-07-04 ENCOUNTER — Encounter: Payer: Self-pay | Admitting: Osteopathic Medicine

## 2020-07-04 DIAGNOSIS — E038 Other specified hypothyroidism: Secondary | ICD-10-CM

## 2020-07-04 DIAGNOSIS — E063 Autoimmune thyroiditis: Secondary | ICD-10-CM

## 2020-07-04 MED ORDER — LEVOTHYROXINE SODIUM 75 MCG PO TABS: 75.0000 ug | ORAL_TABLET | Freq: Every day | ORAL | 0 refills | Status: DC

## 2020-07-12 ENCOUNTER — Other Ambulatory Visit: Payer: Self-pay | Admitting: Osteopathic Medicine

## 2020-07-12 DIAGNOSIS — E038 Other specified hypothyroidism: Secondary | ICD-10-CM

## 2020-07-27 ENCOUNTER — Ambulatory Visit (INDEPENDENT_AMBULATORY_CARE_PROVIDER_SITE_OTHER): Payer: Managed Care, Other (non HMO) | Admitting: Osteopathic Medicine

## 2020-07-27 ENCOUNTER — Other Ambulatory Visit: Payer: Self-pay

## 2020-07-27 ENCOUNTER — Encounter: Payer: Self-pay | Admitting: Osteopathic Medicine

## 2020-07-27 VITALS — BP 112/77 | HR 65 | Temp 98.0°F | Ht 65.0 in | Wt 167.0 lb

## 2020-07-27 DIAGNOSIS — E039 Hypothyroidism, unspecified: Secondary | ICD-10-CM | POA: Diagnosis not present

## 2020-07-27 DIAGNOSIS — Z0000 Encounter for general adult medical examination without abnormal findings: Secondary | ICD-10-CM | POA: Diagnosis not present

## 2020-07-27 DIAGNOSIS — F418 Other specified anxiety disorders: Secondary | ICD-10-CM | POA: Diagnosis not present

## 2020-07-27 NOTE — Patient Instructions (Signed)
General Preventive Care  Most recent routine screening labs: ordered today.   Blood pressure goal 130/80 or less.   Tobacco: don't!   Alcohol: responsible moderation is ok for most adults - if you have concerns about your alcohol intake, please talk to me!   Exercise: as tolerated to reduce risk of cardiovascular disease and diabetes. Strength training will also prevent osteoporosis.   Mental health: if need for mental health care (adjust medicines, seek counseling, other), please let me know!   Sexual / Reproductive health: if need for STD testing, or if concerns with libido/pain problems, please let me know! If you need to discuss family planning, please let me know!    Advanced Directive: Living Will and/or Healthcare Power of Attorney recommended for all adults, regardless of age or health.  Vaccines  Flu vaccine: for almost everyone, every fall.   Shingles vaccine: all done!  Pneumonia vaccines: after age 63  Tetanus booster: every 10 years, due 2031  COVID vaccine: THANKS for getting your vaccine! :)  Cancer screenings   Colon cancer screening: for everyone age 63-75. Colonoscopy available for all, many people also qualify for the Cologuard stool test   Breast cancer screening: mammogram annually age 45-75.   Cervical cancer screening: Pap every 1 to 5 years depending on age and other risk factors. Can usually stop at age 20 or w/ hysterectomy.   Lung cancer screening: not needed for non-smokers  Infection screenings  . HIV: recommended screening at least once age 25-65 . Gonorrhea/Chlamydia: screening as needed . Hepatitis C: recommended once for everyone age 61-75 . TB: certain at-risk populations, or depending on work requirements and/or travel history Other . Bone Density Test: recommended for women at age 55

## 2020-07-27 NOTE — Progress Notes (Signed)
[name] is a 54 y.o. female who presents to  [place] at [facility]  today, 07/27/20, seeking care for the following:  . Annual physical      ASSESSMENT & PLAN with other pertinent findings:  The primary encounter diagnosis was Annual physical exam. Diagnoses of Hypothyroidism, unspecified type and Anxiety with depression were also pertinent to this visit.   No results found for this or any previous visit (from the past 24 hour(s)).   Patient Instructions  General Preventive Care  Most recent routine screening labs: ordered today.   Blood pressure goal 130/80 or less.   Tobacco: don't!   Alcohol: responsible moderation is ok for most adults - if you have concerns about your alcohol intake, please talk to me!   Exercise: as tolerated to reduce risk of cardiovascular disease and diabetes. Strength training will also prevent osteoporosis.   Mental health: if need for mental health care (adjust medicines, seek counseling, other), please let me know!   Sexual / Reproductive health: if need for STD testing, or if concerns with libido/pain problems, please let me know! If you need to discuss family planning, please let me know!    Advanced Directive: Living Will and/or Healthcare Power of Attorney recommended for all adults, regardless of age or health.  Vaccines  Flu vaccine: for almost everyone, every fall.   Shingles vaccine: all done!  Pneumonia vaccines: after age 68  Tetanus booster: every 10 years, due 2031  COVID vaccine: THANKS for getting your vaccine! :)  Cancer screenings   Colon cancer screening: for everyone age 56-75. Colonoscopy available for all, many people also qualify for the Cologuard stool test   Breast cancer screening: mammogram annually age 81-75.   Cervical cancer screening: Pap every 1 to 5 years depending on age and other risk factors. Can usually stop at age 68 or w/ hysterectomy.   Lung  cancer screening: not needed for non-smokers  Infection screenings  . HIV: recommended screening at least once age 39-65 . Gonorrhea/Chlamydia: screening as needed . Hepatitis C: recommended once for everyone age 62-75 . TB: certain at-risk populations, or depending on work requirements and/or travel history Other . Bone Density Test: recommended for women at age 20   Orders Placed This Encounter  Procedures  . CBC  . COMPLETE METABOLIC PANEL WITH GFR  . Lipid panel  . TSH    No orders of the defined types were placed in this encounter.  Constitutional:  . VSS, see nurse notes . General Appearance: alert, well-developed, well-nourished, NAD Eyes: Marland Kitchen Normal lids and conjunctive, non-icteric sclera Neck: . No masses, trachea midline . No thyroid enlargement/tenderness/mass appreciated Respiratory: . Normal respiratory effort  . Breath sounds normal, no wheeze/rhonchi/rales Cardiovascular: . S1/S2 normal, no murmur/rub/gallop auscultated . No lower extremity edema Gastrointestinal: . Nontender, no masses . No hepatomegaly, no splenomegaly . No hernia appreciated Musculoskeletal:  . Gait normal . No clubbing/cyanosis of digits Neurological: . No cranial nerve deficit on limited exam . Motor and sensation intact and symmetric Psychiatric: . Normal judgment/insight . Normal mood and affect      Follow-up instructions: Return in about 1 year (around 07/27/2021) for [employer].                                         BP 112/77 (BP Location: Left Arm, Patient Position:  Sitting)   Pulse 65   Temp 98 F (36.7 C)   Ht 5\' 5"  (1.651 m)   Wt 167 lb (75.8 kg)   LMP 11/01/2017 (Exact Date)   SpO2 96%   BMI 27.79 kg/m   Current Meds  Medication Sig  . escitalopram (LEXAPRO) 10 MG tablet Take 1 tablet (10 mg total) by mouth at bedtime.  Marland Kitchen levothyroxine (SYNTHROID) 75 MCG tablet TAKE 1 TABLET(75 MCG) BY MOUTH DAILY BEFORE  BREAKFAST. LABS AND APPOINTMENT NEEDED    No results found for this or any previous visit (from the past 72 hour(s)).  No results found.     All questions at time of visit were answered - patient instructed to contact office with any additional concerns or updates.  ER/RTC precautions were reviewed with the patient as applicable.   Please note: voice recognition software was used to produce this document, and typos may escape review. Please contact Dr. Sheppard Coil for any needed clarifications.

## 2020-08-12 LAB — COMPLETE METABOLIC PANEL WITH GFR
AG Ratio: 1.9 (calc) (ref 1.0–2.5)
ALT: 23 U/L (ref 6–29)
AST: 17 U/L (ref 10–35)
Albumin: 4.8 g/dL (ref 3.6–5.1)
Alkaline phosphatase (APISO): 73 U/L (ref 37–153)
BUN: 14 mg/dL (ref 7–25)
CO2: 28 mmol/L (ref 20–32)
Calcium: 10 mg/dL (ref 8.6–10.4)
Chloride: 103 mmol/L (ref 98–110)
Creat: 0.9 mg/dL (ref 0.50–1.05)
GFR, Est African American: 83 mL/min/{1.73_m2} (ref >=60)
GFR, Est Non African American: 71 mL/min/{1.73_m2} (ref >=60)
Globulin: 2.5 g/dL (calc) (ref 1.9–3.7)
Glucose, Bld: 99 mg/dL (ref 65–99)
Potassium: 4.5 mmol/L (ref 3.5–5.3)
Sodium: 141 mmol/L (ref 135–146)
Total Bilirubin: 0.8 mg/dL (ref 0.2–1.2)
Total Protein: 7.3 g/dL (ref 6.1–8.1)

## 2020-08-12 LAB — CBC
HCT: 43.3 % (ref 35.0–45.0)
Hemoglobin: 14.5 g/dL (ref 11.7–15.5)
MCH: 30.3 pg (ref 27.0–33.0)
MCHC: 33.5 g/dL (ref 32.0–36.0)
MCV: 90.6 fL (ref 80.0–100.0)
MPV: 11 fL (ref 7.5–12.5)
Platelets: 202 10*3/uL (ref 140–400)
RBC: 4.78 10*6/uL (ref 3.80–5.10)
RDW: 12.2 % (ref 11.0–15.0)
WBC: 8.1 10*3/uL (ref 3.8–10.8)

## 2020-08-12 LAB — LIPID PANEL
Cholesterol: 207 mg/dL — ABNORMAL HIGH (ref <200)
HDL: 72 mg/dL (ref >=50)
LDL Cholesterol (Calc): 119 mg/dL (calc) — ABNORMAL HIGH
Non-HDL Cholesterol (Calc): 135 mg/dL (calc) — ABNORMAL HIGH (ref <130)
Total CHOL/HDL Ratio: 2.9 (calc) (ref <5.0)
Triglycerides: 64 mg/dL (ref <150)

## 2020-08-12 LAB — TSH: TSH: 0.48 mIU/L (ref 0.40–4.50)

## 2020-08-16 ENCOUNTER — Encounter: Payer: Self-pay | Admitting: Osteopathic Medicine

## 2020-08-16 ENCOUNTER — Other Ambulatory Visit: Payer: Self-pay

## 2020-08-16 DIAGNOSIS — E038 Other specified hypothyroidism: Secondary | ICD-10-CM

## 2020-08-16 MED ORDER — LEVOTHYROXINE SODIUM 75 MCG PO TABS: 75.0000 ug | ORAL_TABLET | Freq: Every day | ORAL | 0 refills | Status: DC

## 2020-11-11 ENCOUNTER — Other Ambulatory Visit: Payer: Self-pay | Admitting: Osteopathic Medicine

## 2020-11-11 DIAGNOSIS — E038 Other specified hypothyroidism: Secondary | ICD-10-CM

## 2020-11-11 DIAGNOSIS — E063 Autoimmune thyroiditis: Secondary | ICD-10-CM

## 2020-11-11 MED ORDER — LEVOTHYROXINE SODIUM 75 MCG PO TABS: 75.0000 ug | ORAL_TABLET | Freq: Every day | ORAL | 3 refills | Status: DC

## 2020-11-30 ENCOUNTER — Encounter (HOSPITAL_COMMUNITY): Payer: Self-pay

## 2021-01-25 ENCOUNTER — Encounter: Payer: Self-pay | Admitting: Osteopathic Medicine

## 2021-02-01 ENCOUNTER — Ambulatory Visit (INDEPENDENT_AMBULATORY_CARE_PROVIDER_SITE_OTHER): Payer: Managed Care, Other (non HMO)

## 2021-02-01 ENCOUNTER — Ambulatory Visit (INDEPENDENT_AMBULATORY_CARE_PROVIDER_SITE_OTHER): Payer: Managed Care, Other (non HMO) | Admitting: Sports Medicine

## 2021-02-01 ENCOUNTER — Encounter: Payer: Self-pay | Admitting: Sports Medicine

## 2021-02-01 ENCOUNTER — Other Ambulatory Visit: Payer: Self-pay

## 2021-02-01 ENCOUNTER — Ambulatory Visit: Payer: Managed Care, Other (non HMO) | Admitting: Osteopathic Medicine

## 2021-02-01 DIAGNOSIS — M7541 Impingement syndrome of right shoulder: Secondary | ICD-10-CM | POA: Insufficient documentation

## 2021-02-01 DIAGNOSIS — D1722 Benign lipomatous neoplasm of skin and subcutaneous tissue of left arm: Secondary | ICD-10-CM

## 2021-02-01 DIAGNOSIS — R2232 Localized swelling, mass and lump, left upper limb: Secondary | ICD-10-CM | POA: Diagnosis not present

## 2021-02-01 DIAGNOSIS — M7701 Medial epicondylitis, right elbow: Secondary | ICD-10-CM

## 2021-02-01 DIAGNOSIS — M7542 Impingement syndrome of left shoulder: Secondary | ICD-10-CM

## 2021-02-01 MED ORDER — MELOXICAM 15 MG PO TABS: ORAL_TABLET | ORAL | 3 refills | Status: DC

## 2021-02-01 NOTE — Assessment & Plan Note (Signed)
With her rotator cuff disease this is likely from overuse, meloxicam, rehab exercises given, declines therapy. Return to see me in 4 to 6 weeks for this.

## 2021-02-01 NOTE — Progress Notes (Addendum)
    Procedures performed today:    None.  Independent interpretation of notes and tests performed by another provider:   Ultrasound personally reviewed, images show more of a cystic hypoechoic mass rather than a isoechoic mass as would be expected with a lipoma.  This raises the likelihood of hematoma.  Brief History, Exam, Impression, and Recommendations:    Mass of forearm, left There does appear to be a lipoma on the dorsal left forearm, she will make a 30-minute follow-up for surgical excision.  Ultrasound personally reviewed and does show cystic changes consistent more with hematoma than a lipoma, this is consistent with her history of trauma. I can still evacuate this surgically.  Impingement syndrome of both shoulders Bilateral, Neer's, Hawkins and empty can signs positive, works at [employer] heavy [employer]. Declines physical therapy, switching to meloxicam, getting some x-rays, return to see me in 4 to 6 weeks for this. She does understand that without PT she will continue to have pain particularly with this job.  Medial epicondylitis, right With her rotator cuff disease this is likely from overuse, meloxicam, rehab exercises given, declines therapy. Return to see me in 4 to 6 weeks for this.    ___________________________________________ Gwen Her. Dianah Field, M.D., ABFM., CAQSM. Primary Care and [place] Instructor of [place] [facility]

## 2021-02-01 NOTE — Assessment & Plan Note (Signed)
Bilateral, Neer's, Hawkins and empty can signs positive, works at [employer] heavy [employer]. Declines physical therapy, switching to meloxicam, getting some x-rays, return to see me in 4 to 6 weeks for this. She does understand that without PT she will continue to have pain particularly with this job.

## 2021-02-01 NOTE — Assessment & Plan Note (Addendum)
There does appear to be a lipoma on the dorsal left forearm, she will make a 30-minute follow-up for surgical excision.  Ultrasound personally reviewed and does show cystic changes consistent more with hematoma than a lipoma, this is consistent with her history of trauma. I can still evacuate this surgically.

## 2021-02-06 ENCOUNTER — Ambulatory Visit (INDEPENDENT_AMBULATORY_CARE_PROVIDER_SITE_OTHER): Payer: Managed Care, Other (non HMO) | Admitting: Sports Medicine

## 2021-02-06 ENCOUNTER — Other Ambulatory Visit: Payer: Self-pay

## 2021-02-06 DIAGNOSIS — R2232 Localized swelling, mass and lump, left upper limb: Secondary | ICD-10-CM

## 2021-02-06 MED ORDER — HYDROCODONE-ACETAMINOPHEN 5-325 MG PO TABS: 1.0000 | ORAL_TABLET | Freq: Three times a day (TID) | ORAL | 0 refills | Status: DC | PRN

## 2021-02-06 NOTE — Progress Notes (Signed)
    Procedures performed today:    Procedure:  Excision of 2.5 cm subcutaneous mass, left forearm Risks, benefits, and alternatives explained and consent obtained. Time out conducted. Surface prepped with alcohol. 5cc lidocaine with epinephine infiltrated in a field block. Adequate anesthesia ensured. Area prepped and draped in a sterile fashion. Excision performed with: Using #10 blade I made a linear incision, then using both sharp and blunt dissection I carried the procedure down to the mass which was in fact a lipoma, this was removed en bloc.  I then closed the incision with #3 4-0 Ethilon horizontal mattress sutures. Hemostasis achieved. Pt stable.  Independent interpretation of notes and tests performed by another provider:   None.  Brief History, Exam, Impression, and Recommendations:    Mass of forearm, left Mass was excised, was in fact a lipoma, no hematoma encountered. Hydrocodone for postoperative pain. Return to see me in 10 days for suture removal/wound check.    ___________________________________________ Gwen Her. Dianah Field, M.D., ABFM., CAQSM. Primary Care and [place] Instructor of [place] of [facility] of Medicine

## 2021-02-06 NOTE — Patient Instructions (Signed)
Incision Care, Adult An incision is a surgical cut that is made through your skin. Most incisions are closed after a surgical procedure. Your incision may be closed with stitches (sutures), staples, skin glue, or adhesive strips. You may need to return to your health care provider to have sutures or staples removed. This may occur several days or several weeks after your surgery. Until then, the incision needs to be cared for properly to prevent infection. Follow instructions from your health care provider about how to care for your incision. Supplies needed:  Soap, water, and a clean hand towel.  Wound cleanser.  A clean bandage (dressing), if needed.  Cream or ointment, if told by your health care provider.  Clean gauze. How to care for your incision Cleaning the incision Ask your health care provider how to clean the incision. This may include:  Using mild soap and water, or wound cleanser.  Using a clean gauze to pat the incision dry after cleaning it. Dressing changes  Wash your hands with soap and water for at least 20 seconds before and after you change the dressing. If soap and water are not available, use hand sanitizer.  Change your dressing as told by your health care provider.  Leave sutures, staples, skin glue, or adhesive strips in place. These skin closures may need to stay in place for 2 weeks or longer. If adhesive strip edges start to loosen and curl up, you may trim the loose edges. Do not remove adhesive strips completely unless your health care provider tells you to do that.  Apply cream or ointment. Do this only as told by your health care provider.  Cover the incision with a clean dressing. Ask your health care provider when you can begin leaving the incision uncovered. Checking for infection Check your incision area every day for signs of infection. Check for:  More redness, swelling, or pain.  More fluid or blood.  Warmth.  Pus or a bad smell.    Follow these instructions at home Medicines  Take over-the-counter and prescription medicines only as told by your health care provider.  If you were prescribed an antibiotic medicine, cream, or ointment, take or apply it as told by your health care provider. Do not stop using the antibiotic even if your condition improves. Eating and drinking  Eat a diet that includes protein, vitamin A, vitamin C, and other nutrient-rich foods to help the wound heal. ? Foods rich in protein include meat, fish, eggs, dairy, beans, and nuts. ? Foods rich in vitamin A include carrots and dark green, leafy vegetables. ? Foods rich in vitamin C include citrus fruits, tomatoes, broccoli, and peppers.  Drink enough fluid to keep your urine pale yellow. General instructions  Do not take baths, swim, use a hot tub, or do anything that would put the incision underwater until your health care provider approves. Ask your health care provider if you may take showers. You may only be allowed to take sponge baths.  Limit movement around your incision to promote healing. ? Avoid straining, lifting, or exercising for the first 2 weeks after your procedure, or for as long as told by your health care provider. ? Return to your normal activities as told by your health care provider. Ask your health care provider what activities are safe for you.  Do not scratch or pick at the incision. Keep it covered as told by your health care provider.  Protect your incision from the sun when you are   outside for the first 6 months, or for as long as told by your health care provider. Cover up the scar area or apply sunscreen that has an SPF of at least 30.  Do not use any products that contain nicotine or tobacco, such as cigarettes, e-cigarettes, and chewing tobacco. These can delay incision healing after surgery. If you need help quitting, ask your health care provider.  Keep all follow-up visits as told by your health care  provider. This is important.   Contact a health care provider if:  You have any of these signs of infection: ? More redness, swelling, or pain around your incision. ? More fluid or blood coming from your incision. ? Warmth coming from your incision. ? Pus or a bad smell coming from your incision. ? A fever.  You are nauseous or you vomit.  You are dizzy.  Your sutures, staples, skin glue, or adhesive strips come undone. Get help right away if:  You have a red streak on the skin near your incision.  Your incision bleeds through the dressing and the bleeding does not stop with gentle pressure.  The edges of your incision open up and separate.  You have signs of a serious bodily reaction to an infection. These signs may include: ? Fever, shaking chills, or feeling very cold. ? Confusion or anxiety. ? Severe pain. ? Trouble breathing. ? Fast heartbeat. ? Clammy or sweaty skin. ? A rash. These symptoms may represent a serious problem that is an emergency. Do not wait to see if the symptoms will go away. Get medical help right away. Call your local emergency services (911 in the [place] Do not drive yourself to the hospital. Summary  Follow instructions from your health care provider about how to care for your incision.  Wash your hands with soap and water for at least 20 seconds before and after you change the dressing. If soap and water are not available, use hand sanitizer.  Check your incision area every day for signs of infection.  Keep all follow-up visits as told by your health care provider. This is important. This information is not intended to replace advice given to you by your health care provider. Make sure you discuss any questions you have with your health care provider. Document Revised: 08/12/2019 Document Reviewed: 08/12/2019 Elsevier Patient Education  2021 Elsevier Inc.   

## 2021-02-06 NOTE — Assessment & Plan Note (Addendum)
Mass was excised, was in fact a lipoma, no hematoma encountered. Hydrocodone for postoperative pain. Return to see me in 10 days for suture removal/wound check.

## 2021-02-16 ENCOUNTER — Encounter: Payer: Self-pay | Admitting: Sports Medicine

## 2021-02-16 ENCOUNTER — Other Ambulatory Visit: Payer: Self-pay

## 2021-02-16 ENCOUNTER — Ambulatory Visit (INDEPENDENT_AMBULATORY_CARE_PROVIDER_SITE_OTHER): Payer: Managed Care, Other (non HMO) | Admitting: Sports Medicine

## 2021-02-16 DIAGNOSIS — R2232 Localized swelling, mass and lump, left upper limb: Secondary | ICD-10-CM

## 2021-02-16 NOTE — Assessment & Plan Note (Addendum)
[name] is a very pleasant 55 year old female, 10 days ago I excised a lipoma from her left dorsal forearm, she has done extremely well. She had no pain postop. Today I removed 2 of her sutures, it looks like the third suture fell off on its own. We aspirated a serosanguineous seroma, this will not be billed for as we are in the global period, applied some Dermabond and I will see her back 1 more time in a month.

## 2021-02-16 NOTE — Progress Notes (Signed)
    Procedures performed today:    Procedure: Real-time Ultrasound Guided  aspiration of left forearm postoperative seroma Device: Samsung HS60  Verbal informed consent obtained.  Time-out conducted.  Noted no overlying erythema, induration, or other signs of local infection.  Skin prepped in a sterile fashion.  Local anesthesia: Topical Ethyl chloride.  With sterile technique and under real time ultrasound guidance:  Using a 22-gauge needle advanced into the seroma and aspirated approximately 1.5 mL of serosanguineous fluid. Completed without difficulty  Advised to call if fevers/chills, erythema, induration, drainage, or persistent bleeding.  Impression: Technically successful ultrasound guided seroma aspiration.  Independent interpretation of notes and tests performed by another provider:   None.  Brief History, Exam, Impression, and Recommendations:    Mass of forearm, left Ane is a very pleasant 55 year old female, 10 days ago I excised a lipoma from her left dorsal forearm, she has done extremely well. She had no pain postop. Today I removed 2 of her sutures, it looks like the third suture fell off on its own. We aspirated a serosanguineous seroma, this will not be billed for as we are in the global period, applied some Dermabond and I will see her back 1 more time in a month.    ___________________________________________ Gwen Her. Dianah Field, M.D., ABFM., CAQSM. Primary Care and [place] Instructor of [place] of [facility] of Medicine

## 2021-03-15 ENCOUNTER — Other Ambulatory Visit (HOSPITAL_BASED_OUTPATIENT_CLINIC_OR_DEPARTMENT_OTHER): Payer: Self-pay | Admitting: Osteopathic Medicine

## 2021-03-15 ENCOUNTER — Other Ambulatory Visit (HOSPITAL_BASED_OUTPATIENT_CLINIC_OR_DEPARTMENT_OTHER): Payer: Self-pay | Admitting: Family Medicine

## 2021-03-15 ENCOUNTER — Other Ambulatory Visit: Payer: Self-pay | Admitting: Family Medicine

## 2021-03-15 DIAGNOSIS — Z1231 Encounter for screening mammogram for malignant neoplasm of breast: Secondary | ICD-10-CM

## 2021-03-22 ENCOUNTER — Ambulatory Visit (INDEPENDENT_AMBULATORY_CARE_PROVIDER_SITE_OTHER): Payer: Managed Care, Other (non HMO)

## 2021-03-22 ENCOUNTER — Other Ambulatory Visit: Payer: Self-pay

## 2021-03-22 ENCOUNTER — Ambulatory Visit: Payer: Managed Care, Other (non HMO)

## 2021-03-22 ENCOUNTER — Ambulatory Visit (INDEPENDENT_AMBULATORY_CARE_PROVIDER_SITE_OTHER): Payer: Managed Care, Other (non HMO) | Admitting: Sports Medicine

## 2021-03-22 DIAGNOSIS — Z1231 Encounter for screening mammogram for malignant neoplasm of breast: Secondary | ICD-10-CM | POA: Diagnosis not present

## 2021-03-22 DIAGNOSIS — R2232 Localized swelling, mass and lump, left upper limb: Secondary | ICD-10-CM

## 2021-03-22 NOTE — Progress Notes (Signed)
    Procedures performed today:    None.  Independent interpretation of notes and tests performed by another provider:   None.  Brief History, Exam, Impression, and Recommendations:    Mass of forearm, left 6 weeks post surgical excision of a lipoma, subsequently we did an aspiration of a postoperative seroma. She returns today doing extremely well, incision is healed well, return as needed    ___________________________________________ Gwen Her. Dianah Field, M.D., ABFM., CAQSM. Primary Care and [place] Instructor of [place] of [facility] of Medicine

## 2021-03-22 NOTE — Assessment & Plan Note (Signed)
6 weeks post surgical excision of a lipoma, subsequently we did an aspiration of a postoperative seroma. She returns today doing extremely well, incision is healed well, return as needed

## 2021-03-29 ENCOUNTER — Ambulatory Visit: Payer: Managed Care, Other (non HMO)

## 2021-11-07 ENCOUNTER — Other Ambulatory Visit: Payer: Self-pay

## 2021-11-07 DIAGNOSIS — E038 Other specified hypothyroidism: Secondary | ICD-10-CM

## 2021-11-07 DIAGNOSIS — Z5181 Encounter for therapeutic drug level monitoring: Secondary | ICD-10-CM

## 2021-11-07 DIAGNOSIS — E785 Hyperlipidemia, unspecified: Secondary | ICD-10-CM

## 2021-11-07 MED ORDER — LEVOTHYROXINE SODIUM 75 MCG PO TABS: 75.0000 ug | ORAL_TABLET | Freq: Every day | ORAL | 3 refills | Status: DC

## 2021-11-07 NOTE — Telephone Encounter (Signed)
Pt scheduled for transfer of care appt on 2/13/023.  Needs refill on levothyroxine.  Refill sent for 45 day supply only.  Pt to come a few days prior to appt for labs.  Orders placed.  Charyl Bigger, CMA

## 2021-12-14 LAB — COMPREHENSIVE METABOLIC PANEL
AG Ratio: 1.9 (calc) (ref 1.0–2.5)
ALT: 23 U/L (ref 6–29)
AST: 20 U/L (ref 10–35)
Albumin: 4.6 g/dL (ref 3.6–5.1)
Alkaline phosphatase (APISO): 72 U/L (ref 37–153)
BUN: 13 mg/dL (ref 7–25)
CO2: 29 mmol/L (ref 20–32)
Calcium: 9.7 mg/dL (ref 8.6–10.4)
Chloride: 105 mmol/L (ref 98–110)
Creat: 0.89 mg/dL (ref 0.50–1.03)
Globulin: 2.4 g/dL (calc) (ref 1.9–3.7)
Glucose, Bld: 94 mg/dL (ref 65–99)
Potassium: 4.8 mmol/L (ref 3.5–5.3)
Sodium: 142 mmol/L (ref 135–146)
Total Bilirubin: 0.7 mg/dL (ref 0.2–1.2)
Total Protein: 7 g/dL (ref 6.1–8.1)

## 2021-12-14 LAB — CBC WITH DIFFERENTIAL/PLATELET
Absolute Monocytes: 567 cells/uL (ref 200–950)
Basophils Absolute: 39 cells/uL (ref 0–200)
Basophils Relative: 0.7 %
Eosinophils Absolute: 231 cells/uL (ref 15–500)
Eosinophils Relative: 4.2 %
HCT: 42.2 % (ref 35.0–45.0)
Hemoglobin: 14.3 g/dL (ref 11.7–15.5)
Lymphs Abs: 1485 cells/uL (ref 850–3900)
MCH: 31 pg (ref 27.0–33.0)
MCHC: 33.9 g/dL (ref 32.0–36.0)
MCV: 91.5 fL (ref 80.0–100.0)
MPV: 10.7 fL (ref 7.5–12.5)
Monocytes Relative: 10.3 %
Neutro Abs: 3179 cells/uL (ref 1500–7800)
Neutrophils Relative %: 57.8 %
Platelets: 196 10*3/uL (ref 140–400)
RBC: 4.61 10*6/uL (ref 3.80–5.10)
RDW: 12.6 % (ref 11.0–15.0)
Total Lymphocyte: 27 %
WBC: 5.5 10*3/uL (ref 3.8–10.8)

## 2021-12-14 LAB — LIPID PANEL
Cholesterol: 209 mg/dL — ABNORMAL HIGH (ref <200)
HDL: 69 mg/dL (ref >=50)
LDL Cholesterol (Calc): 121 mg/dL (calc) — ABNORMAL HIGH
Non-HDL Cholesterol (Calc): 140 mg/dL (calc) — ABNORMAL HIGH (ref <130)
Total CHOL/HDL Ratio: 3 (calc) (ref <5.0)
Triglycerides: 91 mg/dL (ref <150)

## 2021-12-14 LAB — TSH: TSH: 0.88 mIU/L (ref 0.40–4.50)

## 2021-12-18 ENCOUNTER — Ambulatory Visit (INDEPENDENT_AMBULATORY_CARE_PROVIDER_SITE_OTHER): Payer: Managed Care, Other (non HMO) | Admitting: Medical-Surgical

## 2021-12-18 ENCOUNTER — Other Ambulatory Visit: Payer: Self-pay

## 2021-12-18 ENCOUNTER — Encounter: Payer: Self-pay | Admitting: Medical-Surgical

## 2021-12-18 VITALS — BP 109/72 | HR 70 | Resp 20 | Ht 65.0 in | Wt 163.4 lb

## 2021-12-18 DIAGNOSIS — Z7689 Persons encountering health services in other specified circumstances: Secondary | ICD-10-CM | POA: Diagnosis not present

## 2021-12-18 DIAGNOSIS — Z1231 Encounter for screening mammogram for malignant neoplasm of breast: Secondary | ICD-10-CM

## 2021-12-18 NOTE — Progress Notes (Signed)
°  HPI with pertinent ROS:   CC: Transfer of care  HPI: Pleasant 56 year old female presenting today to transfer care to a new PCP and for the following:  Hypothyroidism-taking levothyroxine 75 mcg daily, tolerating well without side effects.  Recently had labs checked with a normal TSH of 0.88.   She is requesting a mammogram.  She had a cluster found in her left breast approximately 2 years ago and had that removed.  She did follow-up as instructed with the breast center but notes that she is due for a mammogram now and her last one was a screening mammogram.  She would like to have that ordered so she can get scheduled.  I reviewed the past medical history, family history, social history, surgical history, and allergies today and no changes were needed.  Please see the problem list section below in epic for further details.   Physical exam:   General: Well Developed, well nourished, and in no acute distress.  Neuro: Alert and oriented x3.  HEENT: Normocephalic, atraumatic.  Skin: Warm and dry. Cardiac: Regular rate and rhythm, no murmurs rubs or gallops, no lower extremity edema.  Respiratory: Clear to auscultation bilaterally. Not using accessory muscles, speaking in full sentences.  Impression and Recommendations:    1. Encounter to establish care Reviewed available information and discussed care concerns with patient.   2. Encounter for screening mammogram for malignant neoplasm of breast Reviewed her history and discussed the need for screening mammogram versus diagnostic.  She notes that her last 1 was screening so we will go ahead and order this.  If it turns out that she needs a diagnostic mammogram, discussed options for completion in the GI breast center in [place] [facility] in [place]. - MM 3D SCREEN BREAST BILATERAL; Future  Return if symptoms worsen or fail to improve. ___________________________________________ Clearnce Sorrel, DNP, APRN,  FNP-BC Primary Care and [place]

## 2022-03-29 ENCOUNTER — Ambulatory Visit (INDEPENDENT_AMBULATORY_CARE_PROVIDER_SITE_OTHER): Payer: Managed Care, Other (non HMO)

## 2022-03-29 DIAGNOSIS — Z1231 Encounter for screening mammogram for malignant neoplasm of breast: Secondary | ICD-10-CM | POA: Diagnosis not present

## 2022-10-27 ENCOUNTER — Other Ambulatory Visit: Payer: Self-pay | Admitting: Family Medicine

## 2022-10-27 DIAGNOSIS — E063 Autoimmune thyroiditis: Secondary | ICD-10-CM

## 2023-02-25 ENCOUNTER — Other Ambulatory Visit: Payer: Self-pay | Admitting: Medical-Surgical

## 2023-02-25 DIAGNOSIS — Z1231 Encounter for screening mammogram for malignant neoplasm of breast: Secondary | ICD-10-CM

## 2023-04-03 ENCOUNTER — Ambulatory Visit (INDEPENDENT_AMBULATORY_CARE_PROVIDER_SITE_OTHER): Payer: 59

## 2023-04-03 DIAGNOSIS — Z1231 Encounter for screening mammogram for malignant neoplasm of breast: Secondary | ICD-10-CM | POA: Diagnosis not present

## 2023-09-09 ENCOUNTER — Telehealth: Payer: Self-pay | Admitting: Medical-Surgical

## 2023-09-09 NOTE — Telephone Encounter (Signed)
Pt called.  She has physical scheduled on 11/15 and is requesting lab order (include thyroid levels)

## 2023-09-20 ENCOUNTER — Ambulatory Visit (INDEPENDENT_AMBULATORY_CARE_PROVIDER_SITE_OTHER): Payer: 59 | Admitting: Medical-Surgical

## 2023-09-20 ENCOUNTER — Encounter: Payer: Self-pay | Admitting: Medical-Surgical

## 2023-09-20 VITALS — BP 122/77 | HR 73 | Resp 20 | Ht 65.0 in | Wt 163.0 lb

## 2023-09-20 DIAGNOSIS — E063 Autoimmune thyroiditis: Secondary | ICD-10-CM

## 2023-09-20 DIAGNOSIS — Z13220 Encounter for screening for lipoid disorders: Secondary | ICD-10-CM

## 2023-09-20 DIAGNOSIS — Z0000 Encounter for general adult medical examination without abnormal findings: Secondary | ICD-10-CM

## 2023-09-20 DIAGNOSIS — M62838 Other muscle spasm: Secondary | ICD-10-CM | POA: Diagnosis not present

## 2023-09-20 MED ORDER — LEVOTHYROXINE SODIUM 75 MCG PO TABS: 75.0000 ug | ORAL_TABLET | Freq: Every day | ORAL | 3 refills | Status: DC

## 2023-09-20 MED ORDER — CYCLOBENZAPRINE HCL 10 MG PO TABS: 5.0000 mg | ORAL_TABLET | Freq: Three times a day (TID) | ORAL | 1 refills | Status: DC | PRN

## 2023-09-20 NOTE — Progress Notes (Signed)
Complete physical exam  Patient: [name]   DOB: 27-Feb-1966   57 y.o. Female  MRN: [id number]  Subjective:    Chief Complaint  Patient presents with   Annual Exam    [name] is a 57 y.o. female who presents today for a complete physical exam. She reports consuming a general diet. The patient does not participate in regular exercise at present. She generally feels well. She reports sleeping poorly. She does not have additional problems to discuss today.   Most recent fall risk assessment:    09/20/2023    2:28 PM  Fall Risk   Falls in the past year? 0  Number falls in past yr: 0  Injury with Fall? 0  Risk for fall due to : No Fall Risks  Follow up Falls evaluation completed     Most recent depression screenings:    09/20/2023    2:33 PM 12/18/2021    2:23 PM  PHQ 2/9 Scores  PHQ - 2 Score 0 0    Vision:Within last year, Dental: No current dental problems and Receives regular dental care, and STD: The patient denies history of sexually transmitted disease.    Patient Care Team: Christen Butter, NP as PCP - General (Nurse Practitioner)   Outpatient Medications Prior to Visit  Medication Sig   [DISCONTINUED] levothyroxine (SYNTHROID) 75 MCG tablet TAKE 1 TABLET(75 MCG) BY MOUTH DAILY BEFORE BREAKFAST   No facility-administered medications prior to visit.    Review of Systems  Constitutional:  Negative for chills, fever, malaise/fatigue and weight loss.  HENT:  Negative for congestion, ear pain, hearing loss, sinus pain and sore throat.   Eyes:  Negative for blurred vision, photophobia and pain.  Respiratory:  Negative for cough, shortness of breath and wheezing.   Cardiovascular:  Negative for chest pain, palpitations and leg swelling.  Gastrointestinal:  Negative for abdominal pain, constipation, diarrhea, heartburn, nausea and vomiting.  Genitourinary:  Negative for dysuria, frequency and urgency.  Musculoskeletal:  Positive for joint pain. Negative  for falls and neck pain.  Skin:  Negative for itching and rash.  Neurological:  Negative for dizziness, weakness and headaches.  Endo/Heme/Allergies:  Negative for polydipsia. Does not bruise/bleed easily.  Psychiatric/Behavioral:  Negative for depression, substance abuse and suicidal ideas. The patient has insomnia. The patient is not nervous/anxious.      Objective:    BP 122/77 (BP Location: Right Arm, Cuff Size: Normal)   Pulse 73   Resp 20   Ht 5\' 5"  (1.651 m)   Wt 163 lb 0.6 oz (74 kg)   LMP 10/05/2017 (Approximate)   SpO2 97%   BMI 27.13 kg/m    Physical Exam Vitals reviewed.  Constitutional:      General: She is not in acute distress.    Appearance: Normal appearance. She is not ill-appearing.  HENT:     Head: Normocephalic and atraumatic.     Right Ear: Tympanic membrane, ear canal and external ear normal. There is no impacted cerumen.     Left Ear: Tympanic membrane, ear canal and external ear normal. There is no impacted cerumen.     Nose: Nose normal. No congestion or rhinorrhea.     Mouth/Throat:     Mouth: Mucous membranes are moist.     Pharynx: No oropharyngeal exudate or posterior oropharyngeal erythema.  Eyes:     General: No scleral icterus.       Right eye: No discharge.  Left eye: No discharge.     Extraocular Movements: Extraocular movements intact.     Conjunctiva/sclera: Conjunctivae normal.     Pupils: Pupils are equal, round, and reactive to light.  Neck:     Thyroid: No thyromegaly.     Vascular: No carotid bruit or JVD.     Trachea: Trachea normal.  Cardiovascular:     Rate and Rhythm: Normal rate and regular rhythm.     Pulses: Normal pulses.     Heart sounds: Normal heart sounds. No murmur heard.    No friction rub. No gallop.  Pulmonary:     Effort: Pulmonary effort is normal. No respiratory distress.     Breath sounds: Normal breath sounds. No wheezing.  Abdominal:     General: Bowel sounds are normal. There is no distension.      Palpations: Abdomen is soft.     Tenderness: There is no abdominal tenderness. There is no guarding.  Musculoskeletal:        General: Normal range of motion.     Cervical back: Normal range of motion and neck supple.  Lymphadenopathy:     Cervical: No cervical adenopathy.  Skin:    General: Skin is warm and dry.  Neurological:     Mental Status: She is alert and oriented to person, place, and time.     Cranial Nerves: No cranial nerve deficit.  Psychiatric:        Mood and Affect: Mood normal.        Behavior: Behavior normal.        Thought Content: Thought content normal.        Judgment: Judgment normal.      No results found for any visits on 09/20/23.     Assessment & Plan:    Routine Health Maintenance and Physical Exam  Immunization History  Administered Date(s) Administered   Tdap 12/07/2019   Zoster Recombinant(Shingrix) 12/07/2019, 03/11/2020    Health Maintenance  Topic Date Due   HIV Screening  Never done   COVID-19 Vaccine (1 - 2023-24 season) 10/06/2023 (Originally 07/07/2023)   INFLUENZA VACCINE  02/03/2024 (Originally 06/06/2023)   Cervical Cancer Screening (HPV/Pap Cotest)  09/19/2024 (Originally 05/01/2022)   Colonoscopy  09/19/2024 (Originally 04/01/2009)   MAMMOGRAM  04/02/2025   DTaP/Tdap/Td (2 - Td or Tdap) 12/06/2029   Hepatitis C Screening  Completed   Zoster Vaccines- Shingrix  Completed   HPV VACCINES  Aged Out    Discussed health benefits of physical activity, and encouraged her to engage in regular exercise appropriate for her age and condition.  1. Annual physical exam Checking labs as below.  Up-to-date on preventative care.  Wellness information provided with AVS. - CBC with Differential/Platelet - CMP14+EGFR - Lipid panel  2. Hypothyroidism due to Hashimoto's thyroiditis Checking TSH today. - TSH  3. Muscle spasm Having intermittent issues with muscle spasms and heart back which also interferes with sleep.  Adding Flexeril 3  times daily as needed for use during spasms.  4. Lipid screening Checking lipids today. - Lipid panel   Return in about 1 year (around 09/19/2024) for annual physical exam.     Christen Butter, NP

## 2023-09-20 NOTE — Patient Instructions (Signed)
Preventive Care 59-59 Years Old, Female Preventive care refers to lifestyle choices and visits with your health care provider that can promote health and wellness. Preventive care visits are also called wellness exams. What can I expect for my preventive care visit? Counseling Your health care provider may ask you questions about your: Medical history, including: Past medical problems. Family medical history. Pregnancy history. Current health, including: Menstrual cycle. Method of birth control. Emotional well-being. Home life and relationship well-being. Sexual activity and sexual health. Lifestyle, including: Alcohol, nicotine or tobacco, and drug use. Access to firearms. Diet, exercise, and sleep habits. Work and work Astronomer. Sunscreen use. Safety issues such as seatbelt and bike helmet use. Physical exam Your health care provider will check your: Height and weight. These may be used to calculate your BMI (body mass index). BMI is a measurement that tells if you are at a healthy weight. Waist circumference. This measures the distance around your waistline. This measurement also tells if you are at a healthy weight and may help predict your risk of certain diseases, such as type 2 diabetes and high blood pressure. Heart rate and blood pressure. Body temperature. Skin for abnormal spots. What immunizations do I need?  Vaccines are usually given at various ages, according to a schedule. Your health care provider will recommend vaccines for you based on your age, medical history, and lifestyle or other factors, such as travel or where you work. What tests do I need? Screening Your health care provider may recommend screening tests for certain conditions. This may include: Lipid and cholesterol levels. Diabetes screening. This is done by checking your blood sugar (glucose) after you have not eaten for a while (fasting). Pelvic exam and Pap test. Hepatitis B test. Hepatitis C  test. HIV (human immunodeficiency virus) test. STI (sexually transmitted infection) testing, if you are at risk. Lung cancer screening. Colorectal cancer screening. Mammogram. Talk with your health care provider about when you should start having regular mammograms. This may depend on whether you have a family history of breast cancer. BRCA-related cancer screening. This may be done if you have a family history of breast, ovarian, tubal, or peritoneal cancers. Bone density scan. This is done to screen for osteoporosis. Talk with your health care provider about your test results, treatment options, and if necessary, the need for more tests. Follow these instructions at home: Eating and drinking  Eat a diet that includes fresh fruits and vegetables, whole grains, lean protein, and low-fat dairy products. Take vitamin and mineral supplements as recommended by your health care provider. Do not drink alcohol if: Your health care provider tells you not to drink. You are pregnant, may be pregnant, or are planning to become pregnant. If you drink alcohol: Limit how much you have to 0-1 drink a day. Know how much alcohol is in your drink. In the [place] one drink equals one 12 oz bottle of beer (355 mL), one 5 oz glass of wine (148 mL), or one 1 oz glass of hard liquor (44 mL). Lifestyle Brush your teeth every morning and night with fluoride toothpaste. Floss one time each day. Exercise for at least 30 minutes 5 or more days each week. Do not use any products that contain nicotine or tobacco. These products include cigarettes, chewing tobacco, and vaping devices, such as e-cigarettes. If you need help quitting, ask your health care provider. Do not use drugs. If you are sexually active, practice safe sex. Use a condom or other form of protection to  prevent STIs. If you do not wish to become pregnant, use a form of birth control. If you plan to become pregnant, see your health care provider for a  prepregnancy visit. Take aspirin only as told by your health care provider. Make sure that you understand how much to take and what form to take. Work with your health care provider to find out whether it is safe and beneficial for you to take aspirin daily. Find healthy ways to manage stress, such as: Meditation, yoga, or listening to music. Journaling. Talking to a trusted person. Spending time with friends and family. Minimize exposure to UV radiation to reduce your risk of skin cancer. Safety Always wear your seat belt while driving or riding in a vehicle. Do not drive: If you have been drinking alcohol. Do not ride with someone who has been drinking. When you are tired or distracted. While texting. If you have been using any mind-altering substances or drugs. Wear a helmet and other protective equipment during sports activities. If you have firearms in your house, make sure you follow all gun safety procedures. Seek help if you have been physically or sexually abused. What's next? Visit your health care provider once a year for an annual wellness visit. Ask your health care provider how often you should have your eyes and teeth checked. Stay up to date on all vaccines. This information is not intended to replace advice given to you by your health care provider. Make sure you discuss any questions you have with your health care provider. Document Revised: 04/19/2021 Document Reviewed: 04/19/2021 Elsevier Patient Education  2024 Elsevier Inc. 1

## 2023-09-21 LAB — CBC WITH DIFFERENTIAL/PLATELET
Basophils Absolute: 0 10*3/uL (ref 0.0–0.2)
Basos: 1 %
EOS (ABSOLUTE): 0.1 10*3/uL (ref 0.0–0.4)
Eos: 1 %
Hematocrit: 46 % (ref 34.0–46.6)
Hemoglobin: 14.7 g/dL (ref 11.1–15.9)
Immature Grans (Abs): 0 10*3/uL (ref 0.0–0.1)
Immature Granulocytes: 0 %
Lymphocytes Absolute: 1.7 10*3/uL (ref 0.7–3.1)
Lymphs: 29 %
MCH: 30.6 pg (ref 26.6–33.0)
MCHC: 32 g/dL (ref 31.5–35.7)
MCV: 96 fL (ref 79–97)
Monocytes Absolute: 0.7 10*3/uL (ref 0.1–0.9)
Monocytes: 11 %
Neutrophils Absolute: 3.4 10*3/uL (ref 1.4–7.0)
Neutrophils: 58 %
Platelets: 205 10*3/uL (ref 150–450)
RBC: 4.8 x10E6/uL (ref 3.77–5.28)
RDW: 12.5 % (ref 11.7–15.4)
WBC: 5.8 10*3/uL (ref 3.4–10.8)

## 2023-09-21 LAB — CMP14+EGFR
ALT: 14 [IU]/L (ref 0–32)
AST: 17 [IU]/L (ref 0–40)
Albumin: 4.9 g/dL (ref 3.8–4.9)
Alkaline Phosphatase: 75 [IU]/L (ref 44–121)
BUN/Creatinine Ratio: 12 (ref 9–23)
BUN: 12 mg/dL (ref 6–24)
Bilirubin Total: 0.4 mg/dL (ref 0.0–1.2)
CO2: 23 mmol/L (ref 20–29)
Calcium: 9.8 mg/dL (ref 8.7–10.2)
Chloride: 103 mmol/L (ref 96–106)
Creatinine, Ser: 1.02 mg/dL — ABNORMAL HIGH (ref 0.57–1.00)
Globulin, Total: 2.7 g/dL (ref 1.5–4.5)
Glucose: 76 mg/dL (ref 70–99)
Potassium: 4.5 mmol/L (ref 3.5–5.2)
Sodium: 142 mmol/L (ref 134–144)
Total Protein: 7.6 g/dL (ref 6.0–8.5)
eGFR: 63 mL/min/{1.73_m2} (ref >59)

## 2023-09-21 LAB — LIPID PANEL
Chol/HDL Ratio: 2.9 ratio (ref 0.0–4.4)
Cholesterol, Total: 226 mg/dL — ABNORMAL HIGH (ref 100–199)
HDL: 77 mg/dL (ref >39)
LDL Chol Calc (NIH): 137 mg/dL — ABNORMAL HIGH (ref 0–99)
Triglycerides: 71 mg/dL (ref 0–149)
VLDL Cholesterol Cal: 12 mg/dL (ref 5–40)

## 2023-09-21 LAB — TSH: TSH: 1.86 u[IU]/mL (ref 0.450–4.500)

## 2023-11-01 ENCOUNTER — Other Ambulatory Visit: Payer: Self-pay | Admitting: Medical-Surgical

## 2023-11-01 DIAGNOSIS — E063 Autoimmune thyroiditis: Secondary | ICD-10-CM

## 2024-02-03 ENCOUNTER — Ambulatory Visit (INDEPENDENT_AMBULATORY_CARE_PROVIDER_SITE_OTHER): Admitting: Medical-Surgical

## 2024-02-03 ENCOUNTER — Ambulatory Visit: Payer: Self-pay

## 2024-02-03 ENCOUNTER — Encounter: Payer: Self-pay | Admitting: Medical-Surgical

## 2024-02-03 VITALS — BP 100/66 | HR 69 | Resp 20

## 2024-02-03 DIAGNOSIS — B9689 Other specified bacterial agents as the cause of diseases classified elsewhere: Secondary | ICD-10-CM | POA: Diagnosis not present

## 2024-02-03 DIAGNOSIS — W5501XA Bitten by cat, initial encounter: Secondary | ICD-10-CM

## 2024-02-03 DIAGNOSIS — L089 Local infection of the skin and subcutaneous tissue, unspecified: Secondary | ICD-10-CM | POA: Diagnosis not present

## 2024-02-03 MED ORDER — AMOXICILLIN-POT CLAVULANATE 875-125 MG PO TABS: 1.0000 | ORAL_TABLET | Freq: Two times a day (BID) | ORAL | 0 refills | Status: DC

## 2024-02-03 NOTE — Progress Notes (Signed)
        Established patient visit  History, exam, impression, and plan:  1. Cat bite, initial encounter (Primary) Very pleasant 58 year old female presenting today for evaluation of a cat bite that she sustained on her face across the tip of her nose.  She has a black cat that is up-to-date on vaccinations that she tried to introduce to one of her dogs.  The dog got very excited and the cat took exception to that.  When the cat was trying to get away, it turned around and ended up biting her on her nose.  One of the cats things went entirely through the nostril on the right lateral border and she sustained further scratches along the tip of her nose and the left lateral border.  Reports that the full puncture on the right lateral border of the nose has been draining clear fluid however along the tip end of her nose she has seen some purulent drainage.  The bite occurred on 01/31/2024.  She did not seek medical evaluation for this.  Notes that she has been getting headaches since this happened and has been using Advil on an as-needed basis.  On exam, the areas indicated are scabbed with surrounding erythema up to the bridge of the nose nearly between the eyes.  Plan to treat with Augmentin twice daily x 7 days.  Discussed site care of the scratches/wounds.   Procedures performed this visit: None.  Return if symptoms worsen or fail to improve.  __________________________________ Thayer Ohm, DNP, APRN, FNP-BC Primary Care and Sports Medicine [facility]

## 2024-02-03 NOTE — Telephone Encounter (Signed)
 Chief Complaint: Cat bite to nose Symptoms: Redness and pus Frequency: Bite occurred on Friday Pertinent Negatives: Patient denies n/a Disposition: [] ED /[] Urgent Care (no appt availability in office) / [x] Appointment(In office/virtual)/ []  [facility]/ [] Home Care/ [] Refused Recommended Disposition /[] [place] [facility]/ []  Follow-up with PCP Additional Notes: Patient called to state her cat bit her nose on Friday when she was introducing him to a large dog. Patient states she is now having redness and pus. Patient's last tetanus booster was in 2021. Patient states cat is fully vaccinated. Appt made for this morning for full evaluation.   Copied from CRM [id number]. Topic: Clinical - Red Word Triage >> Feb 03, 2024  7:33 AM Alessandra Bevels wrote: Red Word that prompted transfer to Nurse Triage: Patient is calling to report that her cat (HE) bite her nose two days ago on her nose. Patient reporting pain with redness and puss coming out. Please advise Reason for Disposition  [1] Puncture wound (hole through the skin) AND [2] from a cat bite (or deep claw puncture wound)  Answer Assessment - Initial Assessment Questions 1. ANIMAL: "What type of animal caused the bite?" "Is the injury from a bite or a claw?" If the animal is a dog or a cat, ask: "Was it a pet or a stray?" "Was it acting ill or behaving strangely?"     Cat (domesticated) 2. LOCATION: "Where is the bite located?"      Nose  3. SIZE: "How big is the bite?" "What does it look like?"      All the way across nose  4. ONSET: "When did the bite happen?" (Minutes or hours ago)      Friday 5. CIRCUMSTANCES: "Tell me how this happened."      Tried to introduce to 110 lb [place] that is his sister's 6. TETANUS: "When was your last tetanus booster?"     2021 7. RABIES VACCINE: For dog or cat bites, ask: "Do you know if the pet is vaccinated against rabies?"  (e.g., yes, no, overdue for rabies shot, unknown) Fully  vaccinated  Protocols used: Animal Bite-A-AH

## 2024-02-07 NOTE — Telephone Encounter (Signed)
 Pt was seen for an appt 3/31. Closing encounter.

## 2024-03-09 ENCOUNTER — Other Ambulatory Visit: Payer: Self-pay | Admitting: Medical-Surgical

## 2024-03-09 DIAGNOSIS — Z1231 Encounter for screening mammogram for malignant neoplasm of breast: Secondary | ICD-10-CM

## 2024-03-13 ENCOUNTER — Ambulatory Visit: Admitting: Medical-Surgical

## 2024-03-19 ENCOUNTER — Ambulatory Visit (INDEPENDENT_AMBULATORY_CARE_PROVIDER_SITE_OTHER): Admitting: Medical-Surgical

## 2024-03-19 ENCOUNTER — Encounter: Payer: Self-pay | Admitting: Medical-Surgical

## 2024-03-19 VITALS — BP 117/77 | HR 68 | Wt 158.0 lb

## 2024-03-19 DIAGNOSIS — L723 Sebaceous cyst: Secondary | ICD-10-CM | POA: Diagnosis not present

## 2024-03-19 MED ORDER — DOXYCYCLINE HYCLATE 100 MG PO TABS: 100.0000 mg | ORAL_TABLET | Freq: Two times a day (BID) | ORAL | 0 refills | Status: DC

## 2024-03-19 NOTE — Progress Notes (Signed)
        Established patient visit  History, exam, impression, and plan:  1. Sebaceous cyst (Primary) Pleasant 58 year old female presenting today with reports of 2 weeks of a spot that was on her back between her shoulder blades.  Noticed it started out like a pimple with some scant drainage however it quickly turned into a larger lump and is now very painful.  She has not tried any treatments for it today.  No fevers, chills, or myalgias.  See below for clinical photos.  Presentation consistent with an inflamed/infected sebaceous cyst.  Plan to treat with doxycycline 100 mg twice daily for 7 days.  We will bring her back in a week for reevaluation and if necessary, complete an I&D at that time.  Discussed warm compresses several times daily.  Also reviewed wound care should the abscess come to ahead and rupture on its own.  Procedures performed this visit: None.  Return in about 1 week (around 03/26/2024) for abscess check.  __________________________________ Maryl Snook, DNP, APRN, FNP-BC Primary Care and Sports Medicine [facility]

## 2024-04-08 ENCOUNTER — Ambulatory Visit

## 2024-04-08 DIAGNOSIS — Z1231 Encounter for screening mammogram for malignant neoplasm of breast: Secondary | ICD-10-CM | POA: Diagnosis not present

## 2024-04-14 ENCOUNTER — Ambulatory Visit: Payer: Self-pay | Admitting: Medical-Surgical

## 2024-05-10 ENCOUNTER — Encounter: Payer: Self-pay | Admitting: Medical-Surgical

## 2024-05-18 ENCOUNTER — Encounter: Payer: Self-pay | Admitting: Medical-Surgical

## 2024-05-18 ENCOUNTER — Ambulatory Visit (INDEPENDENT_AMBULATORY_CARE_PROVIDER_SITE_OTHER): Admitting: Medical-Surgical

## 2024-05-18 VITALS — BP 131/81 | HR 76 | Ht 65.0 in | Wt 160.0 lb

## 2024-05-18 DIAGNOSIS — E663 Overweight: Secondary | ICD-10-CM | POA: Diagnosis not present

## 2024-05-18 MED ORDER — ZEPBOUND 2.5 MG/0.5ML ~~LOC~~ SOAJ: 2.5000 mg | SUBCUTANEOUS | 0 refills | Status: DC

## 2024-05-18 NOTE — Progress Notes (Signed)
        Established patient visit  Discussed the use of AI scribe software for clinical note transcription with the patient, who gave verbal consent to proceed.  History of Present Illness   [name] is a 58 year old female with hypothyroidism who presents with concerns about weight management.  Weight management difficulty - Difficulty achieving weight loss despite regular exercise and dietary modifications - Current BMI is 26.63 - Interested in pharmacologic intervention for weight loss - History of lifelong physical activity, including basketball and consistent exercise routines  Exercise routine - Engages in treadmill and Dreadmax workouts for approximately 1.5 hours, 4-5 days per week  Dietary habits - Follows a gluten-free, low-carbohydrate diet with limited pizza intake - Typical daily intake: coffee for breakfast, Slim Jims for lunch, chicken or [place] sausage with rice cauliflower for dinner - Believes protein intake is low - Does not track food intake or weigh portions  Sleep disturbance - Averages approximately 5 hours of sleep per night - Difficulty sleeping attributed to 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  Constitutional:      General: She is not in acute distress.    Appearance: Normal appearance.  HENT:     Head: Normocephalic and atraumatic.  Cardiovascular:     Rate and Rhythm: Normal rate and regular rhythm.     Pulses: Normal pulses.     Heart sounds: Normal heart sounds. No murmur heard.    No friction rub. No gallop.  Pulmonary:     Effort: Pulmonary effort is normal. No respiratory distress.     Breath sounds: Normal breath sounds. No wheezing.  Skin:    General: Skin is warm and dry.  Neurological:     Mental Status: She is alert and oriented to person, place, and time.  Psychiatric:        Mood and Affect:  Mood normal.        Behavior: Behavior normal.        Thought Content: Thought content normal.        Judgment: Judgment normal.     Assessment and Plan    Weight Management BMI 26.63. Interested in Zepbound . Insufficient calorie and protein intake may hinder weight loss. Explained Zepbound 's benefits and potential weight regain post-discontinuation. - Send prescription for Zepbound  to [facility]. - Increase protein intake to 110-115 grams per day. - Discuss reverse dieting to boost metabolism. - Continue regular exercise and hydration. - Follow up in four weeks if Zepbound  is approved.   Recording duration: 13 minutes     _________________________________ Zada FREDRIK Palin, DNP, APRN, FNP-BC Primary Care and Sports Medicine [facility]

## 2024-05-20 ENCOUNTER — Telehealth: Payer: Self-pay

## 2024-05-20 NOTE — Telephone Encounter (Signed)
 Pharmacy Patient Advocate Encounter  Received notification from [employer] that Prior Authorization for  Zepbound  2.5MG /0.5ML pen-injectors  has been DENIED.  Full denial letter will be uploaded to the media tab. See denial reason below.   PA #/Case ID/Reference #: [id number]

## 2024-05-20 NOTE — Telephone Encounter (Signed)
 Pharmacy Patient Advocate Encounter   Received notification from CoverMyMeds that prior authorization for Zepbound  2.5MG /0.5ML pen-injectors is required/requested.   Insurance verification completed.   The patient is insured through [facility] .   Per test claim: PA required; PA submitted to above mentioned insurance via CoverMyMeds Key/confirmation #/EOC [id number] Status is pending

## 2024-10-14 ENCOUNTER — Ambulatory Visit
Admission: RE | Admit: 2024-10-14 | Discharge: 2024-10-14 | Disposition: A | Discharge status: Home / Self Care | Attending: Family Medicine | Admitting: Family Medicine

## 2024-10-14 VITALS — BP 122/76 | HR 79 | Temp 98.3°F | Resp 18 | Ht 65.0 in | Wt 143.0 lb

## 2024-10-14 DIAGNOSIS — J019 Acute sinusitis, unspecified: Secondary | ICD-10-CM | POA: Diagnosis not present

## 2024-10-14 MED ORDER — AZELASTINE-FLUTICASONE 137-50 MCG/ACT NA SUSP: 1.0000 | Freq: Two times a day (BID) | NASAL | 0 refills | Status: DC

## 2024-10-14 MED ORDER — PREDNISONE 20 MG PO TABS: 40.0000 mg | ORAL_TABLET | Freq: Every day | ORAL | 0 refills | Status: DC

## 2024-10-14 MED ORDER — DOXYCYCLINE HYCLATE 100 MG PO CAPS: 100.0000 mg | ORAL_CAPSULE | Freq: Two times a day (BID) | ORAL | 0 refills | Status: DC

## 2024-10-14 NOTE — Discharge Instructions (Signed)
 Take the antibiotic doxycycline  2 times a day.  Take with food Make sure you are drinking lots of water Take prednisone once a day for 5 days Use the nasal spray twice a day as directed See your doctor if not improving by next week

## 2024-10-14 NOTE — ED Provider Notes (Signed)
 [name]    CSN: [id number] Arrival date & time: 10/14/24  1409      History   Chief Complaint Chief Complaint  Patient presents with   Cough    Have had a cold for 3 weeks, lost voice for a week, continuous draining.  Not going away. Want to see if possible sinus infection. - Entered by patient    HPI [name] is a 58 y.o. female.   HPI  Patient's been sick for over 2 weeks with cough and congestion.  Postnasal drip.  Hoarse voice.  Has taken over-the-counter medicines.  Still cannot clear up her sinuses.  Is here for evaluation.  No fever or chills.  No body ache.  Face pain and headaches are positive  Past Medical History:  Diagnosis Date   Abnormal pap    Abnormal uterine bleeding (AUB) 09/09/2017   History of hiatal hernia    Hyperlipidemia    Hypothyroidism due to Hashimoto's thyroiditis 05/22/2017   IBS (irritable bowel syndrome)    LGSIL on Pap smear of cervix 2012   Metatarsalgia of left foot    Neuritis    PONV (postoperative nausea and vomiting)     Patient Active Problem List   Diagnosis Date Noted   Mass of forearm, left 02/01/2021   Impingement syndrome of both shoulders 02/01/2021   Medial epicondylitis, right 02/01/2021   Chronic fatigue 05/12/2019   Anxiety with depression 05/12/2019   Insomnia secondary to chronic pain 05/12/2019   Chronic midline low back pain without sciatica 05/12/2019   History of endometrial ablation 09/09/2017   Abnormal uterine bleeding (AUB) 09/09/2017   Elevated anti-tissue transglutaminase (tTG) IgA level 05/27/2017   Hypothyroidism due to Hashimoto's thyroiditis 05/22/2017   Family history of celiac sprue 05/22/2017   Generalized abdominal pain 03/16/2011    Past Surgical History:  Procedure Laterality Date   AUGMENTATION MAMMAPLASTY     BREAST EXCISIONAL BIOPSY Left 01/15/2020   ADH   BREAST LUMPECTOMY WITH RADIOACTIVE SEED LOCALIZATION Left 01/15/2020   Procedure: LEFT BREAST LUMPECTOMY  WITH RADIOACTIVE SEED LOCALIZATION;  Surgeon: Curvin Deward MOULD, MD;  Location: [facility];  Service: General;  Laterality: Left;   DILITATION & CURRETTAGE/HYSTROSCOPY WITH HYDROTHERMAL ABLATION N/A 11/13/2017   Procedure: DILATATION & CURETTAGE/HYSTEROSCOPY WITH HYDROTHERMAL ABLATION;  Surgeon: Starla Harland BROCKS, MD;  Location: [facility];  Service: Gynecology;  Laterality: N/A;   ENDOMETRIAL BIOPSY     microdiskectomy  2007   L spine, done in [place]   PLACEMENT OF BREAST IMPLANTS     TONSILLECTOMY     UPPER GI ENDOSCOPY  2008   WISDOM TOOTH EXTRACTION      OB History     Gravida  0   Para  0   Term  0   Preterm  0   AB  0   Living  0      SAB  0   IAB  0   Ectopic  0   Multiple  0   Live Births  0            Home Medications    Prior to Admission medications   Medication Sig Start Date End Date Taking? Authorizing Provider  Azelastine-Fluticasone 137-50 MCG/ACT SUSP Place 1 puff into the nose in the morning and at bedtime. 10/14/24  Yes Maranda Jamee Jacob, MD  doxycycline  (VIBRAMYCIN ) 100 MG capsule Take 1 capsule (100 mg total) by mouth 2 (two) times daily. 10/14/24  Yes Maranda Jamee Jacob, MD  levothyroxine  (SYNTHROID ) 75 MCG tablet Take 75 mcg by mouth daily. 01/19/24  Yes [provider]  predniSONE (DELTASONE) 20 MG tablet Take 2 tablets (40 mg total) by mouth daily with breakfast. 10/14/24  Yes Maranda Jamee Jacob, MD    Family History Family History  Problem Relation Age of Onset   Diabetes Mother    Thyroid  disease Sister    Celiac disease Brother     Social History Social History   Tobacco Use   Smoking status: Never   Smokeless tobacco: Never  Vaping Use   Vaping status: Never Used  Substance Use Topics   Alcohol use: Yes    Alcohol/week: 3.0 standard drinks of alcohol    Types: 3 Cans of beer per week   Drug use: No     Allergies   Codeine, Erythromycin, and Gluten meal Codeine will kill me  Review  of Systems Review of Systems See HPI  Physical Exam Triage Vital Signs ED Triage Vitals  Encounter Vitals Group     BP 10/14/24 1505 122/76     Girls Systolic BP Percentile --      Girls Diastolic BP Percentile --      Boys Systolic BP Percentile --      Boys Diastolic BP Percentile --      Pulse Rate 10/14/24 1505 79     Resp 10/14/24 1505 18     Temp 10/14/24 1505 98.3 F (36.8 C)     Temp Source 10/14/24 1505 Oral     SpO2 10/14/24 1505 95 %     Weight 10/14/24 1504 143 lb (64.9 kg)     Height 10/14/24 1504 5' 5 (1.651 m)     Head Circumference --      Peak Flow --      Pain Score 10/14/24 1504 0     Pain Loc --      Pain Education --      Exclude from Growth Chart --    No data found.  Updated Vital Signs BP 122/76 (BP Location: Right Arm)   Pulse 79   Temp 98.3 F (36.8 C) (Oral)   Resp 18   Ht 5' 5 (1.651 m)   Wt 64.9 kg   LMP 10/05/2017 (Approximate)   SpO2 95%   BMI 23.80 kg/m       Physical Exam Constitutional:      General: She is not in acute distress.    Appearance: She is well-developed. She is ill-appearing.  HENT:     Head: Normocephalic and atraumatic.     Right Ear: Tympanic membrane normal.     Left Ear: Tympanic membrane normal.     Nose: Congestion and rhinorrhea present.     Comments: Nasal turbinate swollen and red    Mouth/Throat:     Mouth: Mucous membranes are moist.     Pharynx: Posterior oropharyngeal erythema present.     Comments: Posterior pharyngeal erythema and cobblestoning Eyes:     Conjunctiva/sclera: Conjunctivae normal.     Pupils: Pupils are equal, round, and reactive to light.  Cardiovascular:     Rate and Rhythm: Normal rate and regular rhythm.     Heart sounds: Normal heart sounds.  Pulmonary:     Effort: Pulmonary effort is normal. No respiratory distress.     Breath sounds: Normal breath sounds.  Musculoskeletal:        General: Normal range of motion.     Cervical back:  Normal range of motion.   Lymphadenopathy:     Cervical: Cervical adenopathy present.  Skin:    General: Skin is warm and dry.  Neurological:     Mental Status: She is alert.      UC Treatments / Results  Labs (all labs ordered are listed, but only abnormal results are displayed) Labs Reviewed - No data to display  EKG   Radiology No results found.  Procedures Procedures (including critical care time)  Medications Ordered in UC Medications - No data to display  Initial Impression / Assessment and Plan / UC Course  I have reviewed the triage vital signs and the nursing notes.  Pertinent labs & imaging results that were available during my care of the patient were reviewed by me and considered in my medical decision making (see chart for details).     Final Clinical Impressions(s) / UC Diagnoses   Final diagnoses:  Acute sinusitis with symptoms > 10 days     Discharge Instructions      Take the antibiotic doxycycline  2 times a day.  Take with food Make sure you are drinking lots of water Take prednisone once a day for 5 days Use the nasal spray twice a day as directed See your doctor if not improving by next week   ED Prescriptions     Medication Sig Dispense Auth. Provider   doxycycline  (VIBRAMYCIN ) 100 MG capsule Take 1 capsule (100 mg total) by mouth 2 (two) times daily. 20 capsule Maranda Jamee Jacob, MD   predniSONE (DELTASONE) 20 MG tablet Take 2 tablets (40 mg total) by mouth daily with breakfast. 10 tablet Maranda Jamee Jacob, MD   Azelastine-Fluticasone 137-50 MCG/ACT SUSP Place 1 puff into the nose in the morning and at bedtime. 23 g Maranda Jamee Jacob, MD      PDMP not reviewed this encounter.   Maranda Jamee Jacob, MD 10/14/24 [phone]

## 2024-10-14 NOTE — ED Triage Notes (Signed)
 Patient c/o productive cough x 3 weeks, a lot of post nasal drainage, sore throat.  Patient has taken Sudafed and Robitussin.  No relief.

## 2024-10-20 ENCOUNTER — Other Ambulatory Visit: Payer: Self-pay

## 2024-10-20 ENCOUNTER — Other Ambulatory Visit: Payer: Self-pay | Admitting: Medical-Surgical

## 2024-10-20 MED ORDER — LEVOTHYROXINE SODIUM 75 MCG PO TABS: 75.0000 ug | ORAL_TABLET | Freq: Every day | ORAL | 0 refills | Status: DC

## 2024-11-02 ENCOUNTER — Encounter: Admitting: Obstetrics & Gynecology

## 2024-11-22 ENCOUNTER — Other Ambulatory Visit: Payer: Self-pay | Admitting: Medical-Surgical

## 2024-11-23 ENCOUNTER — Encounter: Payer: Self-pay | Admitting: Obstetrics & Gynecology

## 2024-11-23 ENCOUNTER — Ambulatory Visit: Admitting: Obstetrics & Gynecology

## 2024-11-23 ENCOUNTER — Other Ambulatory Visit (HOSPITAL_COMMUNITY)
Admission: RE | Admit: 2024-11-23 | Discharge: 2024-11-23 | Disposition: A | Discharge status: Home / Self Care | Source: Ambulatory Visit | Attending: Obstetrics & Gynecology | Admitting: Obstetrics & Gynecology

## 2024-11-23 VITALS — BP 118/77 | HR 71 | Ht 65.0 in | Wt 146.0 lb

## 2024-11-23 DIAGNOSIS — Z01419 Encounter for gynecological examination (general) (routine) without abnormal findings: Secondary | ICD-10-CM | POA: Diagnosis present

## 2024-11-23 DIAGNOSIS — Z1211 Encounter for screening for malignant neoplasm of colon: Secondary | ICD-10-CM

## 2024-11-23 NOTE — Progress Notes (Signed)
 Subjective:     [name] is a 59 y.o. female here for a routine exam.  Current complaints: menopausal sympotoms--not seeking treatemtn.  No bleeding since ablation.     Gynecologic History Patient's last menstrual period was 10/05/2017. Contraception: post menopausal status Last Pap: 2018 in Epic--nml. Last mammogram: 04/2024. Results were: normal Birads 1  Obstetric History OB History  Gravida Para Term Preterm AB Living  0 0 0 0 0 0  SAB IAB Ectopic Multiple Live Births  0 0 0 0 0     The following portions of the patient's history were reviewed and updated as appropriate: allergies, current medications, past family history, past medical history, past social history, past surgical history, and problem list.  Review of Systems Pertinent items noted in HPI and remainder of comprehensive ROS otherwise negative.    Objective:     Vitals:   11/23/24 1603  BP: 118/77  Pulse: 71  Weight: 146 lb (66.2 kg)  Height: 5' 5 (1.651 m)   Vitals:  WNL General appearance: alert, cooperative and no distress  HEENT: Normocephalic, without obvious abnormality, atraumatic Eyes: negative Throat: lips, mucosa, and tongue normal; teeth and gums normal  Respiratory: Clear to auscultation bilaterally  CV: Regular rate and rhythm  Breasts:  Normal appearance, no masses or tenderness, no nipple retraction or dimpling  GI: Soft, non-tender; bowel sounds normal; no masses,  no organomegaly  GU: External Genitalia:  Tanner V, no lesion Urethra:  No prolapse   Vagina: Pink, normal rugae, no blood or discharge  Cervix: No CMT, no lesion  Uterus:  Normal size and contour, non tender  Adnexa: Normal, no masses, non tender  Musculoskeletal: No edema, redness or tenderness in the calves or thighs  Skin: No lesions or rash  Lymphatic: Axillary adenopathy: none     Psychiatric: Normal mood and behavior        Assessment:    Healthy female exam.    Plan:   Pap with HPV co  testing Declines STI screening Annual mammograms Maximize calcium and vit D and weight bearing exercise.  Cologuard ordered

## 2024-11-24 LAB — CYTOLOGY - PAP
Comment: NEGATIVE
Diagnosis: NEGATIVE
High risk HPV: NEGATIVE

## 2024-11-28 ENCOUNTER — Ambulatory Visit: Payer: Self-pay | Admitting: Obstetrics & Gynecology

## 2024-12-01 ENCOUNTER — Encounter: Payer: Self-pay | Admitting: Medical-Surgical

## 2024-12-01 ENCOUNTER — Telehealth (INDEPENDENT_AMBULATORY_CARE_PROVIDER_SITE_OTHER): Admitting: Medical-Surgical

## 2024-12-01 DIAGNOSIS — E063 Autoimmune thyroiditis: Secondary | ICD-10-CM

## 2024-12-01 MED ORDER — LEVOTHYROXINE SODIUM 75 MCG PO TABS: 75.0000 ug | ORAL_TABLET | Freq: Every day | ORAL | 1 refills | Status: AC

## 2024-12-01 NOTE — Progress Notes (Signed)
 Virtual Visit via Video Note  I connected with [name] on 12/01/24 at  2:00 PM EST by a video enabled telemedicine application and verified that I am speaking with the correct person using two identifiers.   I discussed the limitations of evaluation and management by telemedicine and the availability of in person appointments. The patient expressed understanding and agreed to proceed.  Patient location: home Provider locations: office  Subjective:    Discussed the use of AI scribe software for clinical note transcription with the patient, who gave verbal consent to proceed.  History of Present Illness   [name] is a 59 year old female with hypothyroidism who presents for a routine follow-up regarding her levothyroxine  medication.  Thyroid  function and levothyroxine  therapy - Takes levothyroxine  75 mcg once daily for long-term management of hypothyroidism - Compliant with dosing - No hair/skin/nail changes, jitteriness, worsened anxiety, gastrointestinal issues, or hot or cold intolerance     Past medical history, Surgical history, Family history not pertinant except as noted below, Social history, Allergies, and medications have been entered into the medical record, reviewed, and corrections made.   Review of Systems: See HPI for pertinent positives and negatives.   Objective:    General: Speaking clearly in complete sentences without any shortness of breath.  Alert and oriented x3.  Normal judgment. No apparent acute distress.  Impression and Recommendations:    Assessment and Plan    Hypothyroidism due to Hashimoto's thyroiditis Long-term levothyroxine  management. No concerning symptoms. Labs needed for thyroid  function monitoring. - Ordered thyroid  function tests at [facility] or office lab. - Continue levothyroxine  75 mcg daily. - Schedule in-office visit in 4-6 weeks for chronic disease management and preventative care.  General Health  Maintenance Preventative care update needed. - Schedule in-office visit in 4-6 weeks for preventative care.      I discussed the assessment and treatment plan with the patient. The patient was provided an opportunity to ask questions and all were answered. The patient agreed with the plan and demonstrated an understanding of the instructions.   The patient was advised to call back or seek an in-person evaluation if the symptoms worsen or if the condition fails to improve as anticipated.  Return for in office appointment for chronic disease follow up in 4-6 weeks.  Zada FREDRIK Palin, DNP, APRN, FNP-BC [facility] and Sports Medicine

## 2024-12-05 ENCOUNTER — Ambulatory Visit: Payer: Self-pay | Admitting: Medical-Surgical

## 2024-12-05 LAB — TSH: TSH: 1.72 u[IU]/mL (ref 0.450–4.500)

## 2024-12-18 ENCOUNTER — Ambulatory Visit: Admitting: Medical-Surgical
